# Patient Record
Sex: Female | Born: 1990 | Race: Black or African American | Hispanic: No | Marital: Single | Smoking: Former smoker
Health system: Southern US, Community
[De-identification: ages and names within clinical notes are randomized; demographics above are authoritative.]

## PROBLEM LIST (undated history)

## (undated) ENCOUNTER — Inpatient Hospital Stay (HOSPITAL_COMMUNITY): Payer: Self-pay

## (undated) DIAGNOSIS — D219 Benign neoplasm of connective and other soft tissue, unspecified: Secondary | ICD-10-CM

## (undated) DIAGNOSIS — Z8619 Personal history of other infectious and parasitic diseases: Secondary | ICD-10-CM

## (undated) DIAGNOSIS — B9689 Other specified bacterial agents as the cause of diseases classified elsewhere: Secondary | ICD-10-CM

## (undated) DIAGNOSIS — R51 Headache: Secondary | ICD-10-CM

## (undated) DIAGNOSIS — N760 Acute vaginitis: Secondary | ICD-10-CM

## (undated) HISTORY — DX: Personal history of other infectious and parasitic diseases: Z86.19

## (undated) HISTORY — DX: Headache: R51

## (undated) HISTORY — PX: INDUCED ABORTION: SHX677

---

## 2010-09-01 ENCOUNTER — Emergency Department (HOSPITAL_BASED_OUTPATIENT_CLINIC_OR_DEPARTMENT_OTHER)
Admission: EM | Admit: 2010-09-01 | Discharge: 2010-09-01 | Disposition: A | Discharge status: Home / Self Care | Payer: Self-pay | Attending: Emergency Medicine | Admitting: Emergency Medicine

## 2010-09-01 DIAGNOSIS — R112 Nausea with vomiting, unspecified: Secondary | ICD-10-CM | POA: Insufficient documentation

## 2010-09-01 DIAGNOSIS — E860 Dehydration: Secondary | ICD-10-CM | POA: Insufficient documentation

## 2010-11-02 ENCOUNTER — Emergency Department (HOSPITAL_BASED_OUTPATIENT_CLINIC_OR_DEPARTMENT_OTHER)
Admission: EM | Admit: 2010-11-02 | Discharge: 2010-11-02 | Disposition: A | Discharge status: Home / Self Care | Payer: Self-pay | Attending: Emergency Medicine | Admitting: Emergency Medicine

## 2010-11-02 DIAGNOSIS — A499 Bacterial infection, unspecified: Secondary | ICD-10-CM | POA: Insufficient documentation

## 2010-11-02 DIAGNOSIS — N760 Acute vaginitis: Secondary | ICD-10-CM | POA: Insufficient documentation

## 2010-11-02 DIAGNOSIS — B9689 Other specified bacterial agents as the cause of diseases classified elsewhere: Secondary | ICD-10-CM | POA: Insufficient documentation

## 2010-11-02 LAB — URINALYSIS, ROUTINE W REFLEX MICROSCOPIC
Glucose, UA: NEGATIVE mg/dL
Leukocytes, UA: NEGATIVE
pH: 6.5 (ref 5.0–8.0)

## 2010-11-02 LAB — PREGNANCY, URINE: Preg Test, Ur: NEGATIVE

## 2010-11-02 LAB — WET PREP, GENITAL
Trich, Wet Prep: NONE SEEN
Yeast Wet Prep HPF POC: NONE SEEN

## 2010-11-28 ENCOUNTER — Encounter: Payer: Self-pay | Admitting: *Deleted

## 2010-11-28 ENCOUNTER — Emergency Department (INDEPENDENT_AMBULATORY_CARE_PROVIDER_SITE_OTHER): Payer: Self-pay

## 2010-11-28 ENCOUNTER — Emergency Department (HOSPITAL_BASED_OUTPATIENT_CLINIC_OR_DEPARTMENT_OTHER)
Admission: EM | Admit: 2010-11-28 | Discharge: 2010-11-28 | Disposition: A | Discharge status: Home / Self Care | Payer: Self-pay | Attending: Emergency Medicine | Admitting: Emergency Medicine

## 2010-11-28 DIAGNOSIS — Z3490 Encounter for supervision of normal pregnancy, unspecified, unspecified trimester: Secondary | ICD-10-CM

## 2010-11-28 DIAGNOSIS — R109 Unspecified abdominal pain: Secondary | ICD-10-CM

## 2010-11-28 DIAGNOSIS — D259 Leiomyoma of uterus, unspecified: Secondary | ICD-10-CM

## 2010-11-28 DIAGNOSIS — O3410 Maternal care for benign tumor of corpus uteri, unspecified trimester: Secondary | ICD-10-CM

## 2010-11-28 DIAGNOSIS — O210 Mild hyperemesis gravidarum: Secondary | ICD-10-CM | POA: Insufficient documentation

## 2010-11-28 DIAGNOSIS — R110 Nausea: Secondary | ICD-10-CM

## 2010-11-28 DIAGNOSIS — O2690 Pregnancy related conditions, unspecified, unspecified trimester: Secondary | ICD-10-CM | POA: Insufficient documentation

## 2010-11-28 LAB — URINALYSIS, ROUTINE W REFLEX MICROSCOPIC
Bilirubin Urine: NEGATIVE
Hgb urine dipstick: NEGATIVE
Nitrite: NEGATIVE
Specific Gravity, Urine: 1.029 (ref 1.005–1.030)
pH: 6.5 (ref 5.0–8.0)

## 2010-11-28 MED ORDER — PRENATAL RX 60-1 MG PO TABS: 1.0000 | ORAL_TABLET | Freq: Every day | ORAL | Status: AC

## 2010-11-28 NOTE — ED Provider Notes (Signed)
History     Chief Complaint  Patient presents with  . Nausea   HPI Comments: Pt reports LNMP was may 30th. Believes she could be pregnant. crampy lower abdominal pain  Patient is a 20 y.o. female presenting with GI illlness. The history is provided by the patient.  GI Problem  This is a new problem. The current episode started more than 2 days ago. The problem occurs continuously. The problem has not changed since onset.There has been no fever. Associated symptoms include abdominal pain. Pertinent negatives include no vomiting and no chills. She has tried nothing for the symptoms.    History reviewed. No pertinent past medical history.  History reviewed. No pertinent past surgical history.  History reviewed. No pertinent family history.  History  Substance Use Topics  . Smoking status: Never Smoker   . Smokeless tobacco: Not on file  . Alcohol Use: No    OB History    Grav Para Term Preterm Abortions TAB SAB Ect Mult Living                  Review of Systems  Constitutional: Negative for chills.  Gastrointestinal: Positive for abdominal pain. Negative for vomiting.  Genitourinary: Negative for dysuria, frequency, vaginal bleeding, vaginal discharge and pelvic pain.  Neurological: Negative for light-headedness.  All other systems reviewed and are negative.    Physical Exam  BP 120/65  Pulse 87  Temp 98.6 F (37 C)  Resp 16  Wt 214 lb (97.07 kg)  SpO2 98%  LMP 10/19/2010  Physical Exam  Nursing note and vitals reviewed. Constitutional: She is oriented to person, place, and time. She appears well-developed and well-nourished.  HENT:  Head: Normocephalic and atraumatic.  Eyes: EOM are normal.  Neck: Normal range of motion.  Cardiovascular: Normal rate and regular rhythm.   Pulmonary/Chest: Effort normal and breath sounds normal.  Abdominal: Soft.  Musculoskeletal: Normal range of motion.  Neurological: She is alert and oriented to person, place, and time.    Skin: Skin is warm and dry.  Psychiatric: She has a normal mood and affect.    ED Course  Procedures  MDM Lower abd cramping. Positive pregnancy. Will obtain US to evaluate for intrauterine pregnancy. Vitals normal.    1245: pt well appearing. Single IUP. Home with pregnancy precautions and zofran      Lyanne Co, MD 11/28/10 1247

## 2010-11-28 NOTE — ED Notes (Signed)
Pt c/o nausea x 7 days with lower abd  Cramping  LMP may 30 th

## 2011-05-19 ENCOUNTER — Emergency Department (HOSPITAL_BASED_OUTPATIENT_CLINIC_OR_DEPARTMENT_OTHER)
Admission: EM | Admit: 2011-05-19 | Discharge: 2011-05-19 | Disposition: A | Discharge status: Home / Self Care | Payer: PRIVATE HEALTH INSURANCE | Attending: Emergency Medicine | Admitting: Emergency Medicine

## 2011-05-19 ENCOUNTER — Encounter (HOSPITAL_BASED_OUTPATIENT_CLINIC_OR_DEPARTMENT_OTHER): Payer: Self-pay | Admitting: *Deleted

## 2011-05-19 DIAGNOSIS — K118 Other diseases of salivary glands: Secondary | ICD-10-CM

## 2011-05-19 DIAGNOSIS — J029 Acute pharyngitis, unspecified: Secondary | ICD-10-CM | POA: Insufficient documentation

## 2011-05-19 MED ORDER — FAMOTIDINE 20 MG PO TABS: 20.0000 mg | ORAL_TABLET | Freq: Two times a day (BID) | ORAL | Status: DC

## 2011-05-19 MED ORDER — DEXAMETHASONE SODIUM PHOSPHATE 10 MG/ML IJ SOLN
10.0000 mg | Freq: Once | INTRAMUSCULAR | Status: AC
  Administered 2011-05-19: 10 mg
  Filled 2011-05-19: qty 1

## 2011-05-19 MED ORDER — IBUPROFEN 800 MG PO TABS: 800.0000 mg | ORAL_TABLET | Freq: Three times a day (TID) | ORAL | Status: AC

## 2011-05-19 NOTE — ED Notes (Signed)
Pt states she will call today for apt. To follow up with her dr. Rodena Medin asap to have her thyroid checked, and for recheck of her sore throat.

## 2011-05-19 NOTE — ED Notes (Signed)
Pt amb to room 1 with quick steady gait in nad. Pt reports st x 2 weeks, denies any cough, congestion, fever or other c/o.

## 2011-05-19 NOTE — ED Provider Notes (Signed)
History     CSN: [id number]  Arrival date & time 05/19/11  0910   First MD Initiated Contact with Patient 05/19/11 [phone]      Chief Complaint  Patient presents with  . Sore Throat    (Consider location/radiation/quality/duration/timing/severity/associated sxs/prior treatment) HPI Comments: Intermittent sore throat for the past 2 weeks. Denies any associated infectious symptoms. Has no other complaints. Has no dysphasia or odynophagia.  Pain is worse with eating  Patient is a 20 y.o. female presenting with pharyngitis. The history is provided by the patient. No language interpreter was used.  Sore Throat This is a new problem. The current episode started more than 1 week ago (2 weeks ago). The problem occurs constantly. The problem has not changed since onset.Pertinent negatives include no chest pain, no abdominal pain, no headaches and no shortness of breath. The symptoms are aggravated by swallowing. The symptoms are relieved by nothing. She has tried nothing for the symptoms. The treatment provided no relief.    History reviewed. No pertinent past medical history.  History reviewed. No pertinent past surgical history.  History reviewed. No pertinent family history.  History  Substance Use Topics  . Smoking status: Never Smoker   . Smokeless tobacco: Not on file  . Alcohol Use: No    OB History    Grav Para Term Preterm Abortions TAB SAB Ect Mult Living                  Review of Systems  Constitutional: Negative for fever, activity change, appetite change and fatigue.  HENT: Positive for sore throat. Negative for congestion, rhinorrhea, trouble swallowing, neck pain and neck stiffness.   Respiratory: Negative for cough and shortness of breath.   Cardiovascular: Negative for chest pain and palpitations.  Gastrointestinal: Negative for nausea, vomiting and abdominal pain.  Genitourinary: Negative for dysuria, urgency, frequency and flank pain.  Neurological: Negative  for dizziness, weakness, light-headedness, numbness and headaches.  All other systems reviewed and are negative.    Allergies  Review of patient's allergies indicates no known allergies.  Home Medications   Current Outpatient Rx  Name Route Sig Dispense Refill  . FAMOTIDINE 20 MG PO TABS Oral Take 1 tablet (20 mg total) by mouth 2 (two) times daily. 30 tablet 0  . IBUPROFEN 800 MG PO TABS Oral Take 1 tablet (800 mg total) by mouth 3 (three) times daily. 21 tablet 0  . PRENATAL RX 60-1 MG PO TABS Oral Take 1 tablet by mouth daily. 30 tablet 2    BP 113/72  Pulse 75  Temp(Src) 98.6 F (37 C) (Oral)  Resp 20  Ht 5\' 4"  (1.626 m)  Wt 207 lb (93.895 kg)  BMI 35.53 kg/m2  SpO2 100%  LMP 04/22/2011  Physical Exam  Nursing note and vitals reviewed. Constitutional: She is oriented to person, place, and time. She appears well-developed and well-nourished. No distress.  HENT:  Head: Normocephalic and atraumatic.  Mouth/Throat: Oropharynx is clear and moist. No oropharyngeal exudate.  Eyes: Conjunctivae and EOM are normal. Pupils are equal, round, and reactive to light.  Neck: Normal range of motion. Neck supple.       Tender over the R submandibular salivary gland with mild palpable fullness  Cardiovascular: Normal rate, regular rhythm, normal heart sounds and intact distal pulses.  Exam reveals no gallop and no friction rub.   No murmur heard. Pulmonary/Chest: Effort normal and breath sounds normal. No respiratory distress.  Abdominal: Soft. Bowel sounds are normal. There  is no tenderness.  Musculoskeletal: Normal range of motion. She exhibits no tenderness.  Lymphadenopathy:    She has no cervical adenopathy.  Neurological: She is alert and oriented to person, place, and time. No cranial nerve deficit.  Skin: Skin is warm and dry. No rash noted.    ED Course  Procedures (including critical care time)  Labs Reviewed - No data to display No results found.   1. Pharyngitis     2. Submandibular gland tenderness       MDM  Etiologies considered include small thyroid nodule, submandibular salivary stone, pharyngitis. I also considered cured however she lacks any of these associated symptoms. I will treat her with Pepcid and ibuprofen for discomfort. I encouraged her to use Tylenol this to help expel a possible stone. She has no primary care physician therefore I referred her to the primary care practice here at the [facility]. This time she is safe for discharge to home. She has no respiratory or compromise with swallowing therefore I feel there is no indication for imaging at this time.        Dayton Bailiff, MD 05/19/11 [phone]

## 2011-11-04 ENCOUNTER — Emergency Department (HOSPITAL_BASED_OUTPATIENT_CLINIC_OR_DEPARTMENT_OTHER): Payer: Self-pay

## 2011-11-04 ENCOUNTER — Encounter (HOSPITAL_BASED_OUTPATIENT_CLINIC_OR_DEPARTMENT_OTHER): Payer: Self-pay | Admitting: *Deleted

## 2011-11-04 ENCOUNTER — Emergency Department (HOSPITAL_BASED_OUTPATIENT_CLINIC_OR_DEPARTMENT_OTHER)
Admission: EM | Admit: 2011-11-04 | Discharge: 2011-11-05 | Disposition: A | Discharge status: Home / Self Care | Payer: Self-pay | Attending: Emergency Medicine | Admitting: Emergency Medicine

## 2011-11-04 DIAGNOSIS — N760 Acute vaginitis: Secondary | ICD-10-CM

## 2011-11-04 DIAGNOSIS — R109 Unspecified abdominal pain: Secondary | ICD-10-CM | POA: Insufficient documentation

## 2011-11-04 DIAGNOSIS — B373 Candidiasis of vulva and vagina: Secondary | ICD-10-CM

## 2011-11-04 DIAGNOSIS — R197 Diarrhea, unspecified: Secondary | ICD-10-CM

## 2011-11-04 DIAGNOSIS — R112 Nausea with vomiting, unspecified: Secondary | ICD-10-CM

## 2011-11-04 LAB — DIFFERENTIAL
Basophils Absolute: 0 10*3/uL (ref 0.0–0.1)
Basophils Relative: 0 % (ref 0–1)
Eosinophils Absolute: 0.2 10*3/uL (ref 0.0–0.7)
Eosinophils Relative: 1 % (ref 0–5)
Lymphocytes Relative: 15 % (ref 12–46)
Lymphs Abs: 1.9 10*3/uL (ref 0.7–4.0)
Monocytes Absolute: 0.7 10*3/uL (ref 0.1–1.0)
Monocytes Relative: 6 % (ref 3–12)
Neutro Abs: 9.4 10*3/uL — ABNORMAL HIGH (ref 1.7–7.7)
Neutrophils Relative %: 77 % (ref 43–77)

## 2011-11-04 LAB — COMPREHENSIVE METABOLIC PANEL
ALT: 19 U/L (ref 0–35)
AST: 25 U/L (ref 0–37)
Albumin: 4.4 g/dL (ref 3.5–5.2)
Alkaline Phosphatase: 65 U/L (ref 39–117)
BUN: 10 mg/dL (ref 6–23)
CO2: 27 mEq/L (ref 19–32)
Calcium: 9.9 mg/dL (ref 8.4–10.5)
Chloride: 100 mEq/L (ref 96–112)
Creatinine, Ser: 0.7 mg/dL (ref 0.50–1.10)
GFR calc Af Amer: >90 mL/min (ref >90)
GFR calc non Af Amer: >90 mL/min (ref >90)
Glucose, Bld: 81 mg/dL (ref 70–99)
Potassium: 3.6 mEq/L (ref 3.5–5.1)
Sodium: 138 mEq/L (ref 135–145)
Total Bilirubin: 0.6 mg/dL (ref 0.3–1.2)
Total Protein: 8 g/dL (ref 6.0–8.3)

## 2011-11-04 LAB — CBC
HCT: 46.3 % — ABNORMAL HIGH (ref 36.0–46.0)
MCH: 31 pg (ref 26.0–34.0)
MCHC: 35.9 g/dL (ref 30.0–36.0)
RDW: 13.3 % (ref 11.5–15.5)

## 2011-11-04 LAB — URINALYSIS, ROUTINE W REFLEX MICROSCOPIC
Bilirubin Urine: NEGATIVE
Glucose, UA: NEGATIVE mg/dL
Hgb urine dipstick: NEGATIVE
Ketones, ur: NEGATIVE mg/dL
Leukocytes, UA: NEGATIVE
Nitrite: NEGATIVE
Protein, ur: 30 mg/dL — AB
Specific Gravity, Urine: 1.03 (ref 1.005–1.030)
Urobilinogen, UA: 1 mg/dL (ref 0.0–1.0)
pH: 6.5 (ref 5.0–8.0)

## 2011-11-04 LAB — WET PREP, GENITAL: Trich, Wet Prep: NONE SEEN

## 2011-11-04 LAB — URINE MICROSCOPIC-ADD ON

## 2011-11-04 LAB — PREGNANCY, URINE: Preg Test, Ur: NEGATIVE

## 2011-11-04 MED ORDER — FLUCONAZOLE 200 MG PO TABS: 200.0000 mg | ORAL_TABLET | Freq: Every day | ORAL | Status: AC

## 2011-11-04 MED ORDER — ONDANSETRON HCL 4 MG/2ML IJ SOLN
4.0000 mg | Freq: Once | INTRAMUSCULAR | Status: AC
  Administered 2011-11-04: 4 mg via INTRAVENOUS
  Filled 2011-11-04: qty 2

## 2011-11-04 MED ORDER — ONDANSETRON 8 MG PO TBDP: 8.0000 mg | ORAL_TABLET | Freq: Three times a day (TID) | ORAL | Status: AC | PRN

## 2011-11-04 MED ORDER — METRONIDAZOLE 500 MG PO TABS: 500.0000 mg | ORAL_TABLET | Freq: Two times a day (BID) | ORAL | Status: AC

## 2011-11-04 NOTE — ED Provider Notes (Signed)
History     CSN: [id number]  Arrival date & time 11/04/11  1247   First MD Initiated Contact with Patient 11/04/11 1504      3:10 PM HPI Patient reports multiple symptoms. States she is here for lower abdominal pain, nausea, vomiting, diarrhea, vaginal discharge, typical migraine headache and dizziness. States "I think it is just a cold". Denies fever,  abdominal surgeries, history of STDs, back pain, radiation of pain. Reports recent history of urinary tract infection and bacterial vaginosis. Also reports associated with a mild cough for several days. Patient is a 21 y.o. female presenting with abdominal pain. The history is provided by the patient.  Abdominal Pain The primary symptoms of the illness include abdominal pain, nausea, vomiting, diarrhea and vaginal discharge. The primary symptoms of the illness do not include fever, shortness of breath or dysuria.  The vaginal discharge is not associated with dysuria.  Additional symptoms associated with the illness include chills. Symptoms associated with the illness do not include constipation, urgency, hematuria, frequency or back pain.    History reviewed. No pertinent past medical history.  History reviewed. No pertinent past surgical history.  History reviewed. No pertinent family history.  History  Substance Use Topics  . Smoking status: Never Smoker   . Smokeless tobacco: Not on file  . Alcohol Use: No    OB History    Grav Para Term Preterm Abortions TAB SAB Ect Mult Living                  Review of Systems  Constitutional: Positive for chills. Negative for fever.  Respiratory: Negative for shortness of breath.   Cardiovascular: Negative for chest pain.  Gastrointestinal: Positive for nausea, vomiting, abdominal pain and diarrhea. Negative for constipation.  Genitourinary: Positive for vaginal discharge. Negative for dysuria, urgency, frequency, hematuria, flank pain and vaginal pain.  Musculoskeletal: Negative for  back pain.  Neurological: Positive for dizziness and headaches.  All other systems reviewed and are negative.    Allergies  Review of patient's allergies indicates no known allergies.  Home Medications   Current Outpatient Rx  Name Route Sig Dispense Refill  . FAMOTIDINE 20 MG PO TABS Oral Take 1 tablet (20 mg total) by mouth 2 (two) times daily. 30 tablet 0  . PRENATAL RX 60-1 MG PO TABS Oral Take 1 tablet by mouth daily. 30 tablet 2    BP 127/88  Pulse 90  Temp 98 F (36.7 C) (Oral)  Resp 20  Ht 5\' 4"  (1.626 m)  Wt 209 lb (94.802 kg)  BMI 35.87 kg/m2  SpO2 99%  LMP 10/18/2011  Physical Exam  Vitals reviewed. Constitutional: She is oriented to person, place, and time. Vital signs are normal. She appears well-developed and well-nourished.  HENT:  Head: Normocephalic and atraumatic.  Eyes: Conjunctivae are normal. Pupils are equal, round, and reactive to light.  Neck: Normal range of motion. Neck supple.  Cardiovascular: Normal rate, regular rhythm and normal heart sounds.  Exam reveals no friction rub.   No murmur heard. Pulmonary/Chest: Effort normal and breath sounds normal. She has no wheezes. She has no rhonchi. She has no rales. She exhibits no tenderness.  Abdominal: Soft. Bowel sounds are normal. She exhibits no distension and no mass. There is no tenderness. There is no rebound and no guarding.  Genitourinary: There is no tenderness or lesion on the right labia. There is no tenderness or lesion on the left labia. Uterus is tender. Uterus is not enlarged. Cervix  exhibits no motion tenderness and no discharge. No bleeding around the vagina. Vaginal discharge (white thick discharge) found.  Musculoskeletal: Normal range of motion.  Neurological: She is alert and oriented to person, place, and time. Coordination normal.  Skin: Skin is warm and dry. No rash noted. No erythema. No pallor.    ED Course  Procedures  Results for orders placed during the hospital  encounter of 11/04/11  URINALYSIS, ROUTINE W REFLEX MICROSCOPIC      Component Value Range   Color, Urine AMBER (*) YELLOW   APPearance CLEAR  CLEAR   Specific Gravity, Urine 1.030  1.005 - 1.030   pH 6.5  5.0 - 8.0   Glucose, UA NEGATIVE  NEGATIVE mg/dL   Hgb urine dipstick NEGATIVE  NEGATIVE   Bilirubin Urine NEGATIVE  NEGATIVE   Ketones, ur NEGATIVE  NEGATIVE mg/dL   Protein, ur 30 (*) NEGATIVE mg/dL   Urobilinogen, UA 1.0  0.0 - 1.0 mg/dL   Nitrite NEGATIVE  NEGATIVE   Leukocytes, UA NEGATIVE  NEGATIVE  PREGNANCY, URINE      Component Value Range   Preg Test, Ur NEGATIVE  NEGATIVE  URINE MICROSCOPIC-ADD ON      Component Value Range   Squamous Epithelial / LPF FEW (*) RARE   WBC, UA 0-2  <3 WBC/hpf   Bacteria, UA MANY (*) RARE   Urine-Other MUCOUS PRESENT    CBC      Component Value Range   WBC 12.2 (*) 4.0 - 10.5 K/uL   RBC 5.35 (*) 3.87 - 5.11 MIL/uL   Hemoglobin 16.6 (*) 12.0 - 15.0 g/dL   HCT 45.4 (*) 09.8 - 11.9 %   MCV 86.5  78.0 - 100.0 fL   MCH 31.0  26.0 - 34.0 pg   MCHC 35.9  30.0 - 36.0 g/dL   RDW 14.7  82.9 - 56.2 %   Platelets 231  150 - 400 K/uL  DIFFERENTIAL      Component Value Range   Neutrophils Relative 77  43 - 77 %   Neutro Abs 9.4 (*) 1.7 - 7.7 K/uL   Lymphocytes Relative 15  12 - 46 %   Lymphs Abs 1.9  0.7 - 4.0 K/uL   Monocytes Relative 6  3 - 12 %   Monocytes Absolute 0.7  0.1 - 1.0 K/uL   Eosinophils Relative 1  0 - 5 %   Eosinophils Absolute 0.2  0.0 - 0.7 K/uL   Basophils Relative 0  0 - 1 %   Basophils Absolute 0.0  0.0 - 0.1 K/uL  COMPREHENSIVE METABOLIC PANEL      Component Value Range   Sodium 138  135 - 145 mEq/L   Potassium 3.6  3.5 - 5.1 mEq/L   Chloride 100  96 - 112 mEq/L   CO2 27  19 - 32 mEq/L   Glucose, Bld 81  70 - 99 mg/dL   BUN 10  6 - 23 mg/dL   Creatinine, Ser 1.30  0.50 - 1.10 mg/dL   Calcium 9.9  8.4 - 86.5 mg/dL   Total Protein 8.0  6.0 - 8.3 g/dL   Albumin 4.4  3.5 - 5.2 g/dL   AST 25  0 - 37 U/L   ALT 19   0 - 35 U/L   Alkaline Phosphatase 65  39 - 117 U/L   Total Bilirubin 0.6  0.3 - 1.2 mg/dL   GFR calc non Af Amer >90  >90 mL/min   GFR calc  Af Amer >90  >90 mL/min  LIPASE, BLOOD      Component Value Range   Lipase 19  11 - 59 U/L  WET PREP, GENITAL      Component Value Range   Yeast Wet Prep HPF POC FEW (*) NONE SEEN   Trich, Wet Prep NONE SEEN  NONE SEEN   Clue Cells Wet Prep HPF POC MODERATE (*) NONE SEEN   WBC, Wet Prep HPF POC FEW (*) NONE SEEN   Dg Abd Acute W/chest  11/04/2011  *RADIOLOGY REPORT*  Clinical Data: Cough.  Abdominal pain.  ACUTE ABDOMEN SERIES (ABDOMEN 2 VIEW & CHEST 1 VIEW)  Comparison: None.  Findings:  Cardiopericardial silhouette within normal limits. Mediastinal contours normal. Trachea midline.  No airspace disease or effusion.  No free air underneath the hemidiaphragms.  There is a paucity of rectosigmoid gas which is nonspecific.  No pathologic air fluid levels are identified.  IMPRESSION: No acute abnormality.  Nonobstructive bowel gas pattern.  Original Report Authenticated By: Andreas Newport, M.D.     MDM    Patient reports nausea medicine has helped with nausea. Is able tolerate PO without difficulty. Discussed findings of yeast infection and bacterial vaginosis. Will treat appropriately with antibiotics and antiemetics. Patient voices understanding and is ready for discharge      Thomasene Lot, Cordelia Poche 11/04/11 1812

## 2011-11-04 NOTE — ED Notes (Signed)
Pt describes abd pain, N/V x 2 days. Bloody diarrhea. Unable to keep PO's down. Dizzy and states she is also getting a migraine. PERL.

## 2011-11-04 NOTE — Discharge Instructions (Signed)
Bacterial Vaginosis Bacterial vaginosis (BV) is a vaginal infection where the normal balance of bacteria in the vagina is disrupted. The normal balance is then replaced by an overgrowth of certain bacteria. There are several different kinds of bacteria that can cause BV. BV is the most common vaginal infection in women of childbearing age. CAUSES   The cause of BV is not fully understood. BV develops when there is an increase or imbalance of harmful bacteria.   Some activities or behaviors can upset the normal balance of bacteria in the vagina and put women at increased risk including:   Having a new sex partner or multiple sex partners.   Douching.   Using an intrauterine device (IUD) for contraception.   It is not clear what role sexual activity plays in the development of BV. However, women that have never had sexual intercourse are rarely infected with BV.  Women do not get BV from toilet seats, bedding, swimming pools or from touching objects around them.  SYMPTOMS   Grey vaginal discharge.   A fish-like odor with discharge, especially after sexual intercourse.   Itching or burning of the vagina and vulva.   Burning or pain with urination.   Some women have no signs or symptoms at all.  DIAGNOSIS  Your caregiver must examine the vagina for signs of BV. Your caregiver will perform lab tests and look at the sample of vaginal fluid through a microscope. They will look for bacteria and abnormal cells (clue cells), a pH test higher than 4.5, and a positive amine test all associated with BV.  RISKS AND COMPLICATIONS   Pelvic inflammatory disease (PID).   Infections following gynecology surgery.   Developing HIV.   Developing herpes virus.  TREATMENT  Sometimes BV will clear up without treatment. However, all women with symptoms of BV should be treated to avoid complications, especially if gynecology surgery is planned. Female partners generally do not need to be treated. However,  BV may spread between female sex partners so treatment is helpful in preventing a recurrence of BV.   BV may be treated with antibiotics. The antibiotics come in either pill or vaginal cream forms. Either can be used with nonpregnant or pregnant women, but the recommended dosages differ. These antibiotics are not harmful to the baby.   BV can recur after treatment. If this happens, a second round of antibiotics will often be prescribed.   Treatment is important for pregnant women. If not treated, BV can cause a premature delivery, especially for a pregnant woman who had a premature birth in the past. All pregnant women who have symptoms of BV should be checked and treated.   For chronic reoccurrence of BV, treatment with a type of prescribed gel vaginally twice a week is helpful.  HOME CARE INSTRUCTIONS   Finish all medication as directed by your caregiver.   Do not have sex until treatment is completed.   Tell your sexual partner that you have a vaginal infection. They should see their caregiver and be treated if they have problems, such as a mild rash or itching.   Practice safe sex. Use condoms. Only have 1 sex partner.  PREVENTION  Basic prevention steps can help reduce the risk of upsetting the natural balance of bacteria in the vagina and developing BV:  Do not have sexual intercourse (be abstinent).   Do not douche.   Use all of the medicine prescribed for treatment of BV, even if the signs and symptoms go away.     Tell your sex partner if you have BV. That way, they can be treated, if needed, to prevent reoccurrence.  SEEK MEDICAL CARE IF:   Your symptoms are not improving after 3 days of treatment.   You have increased discharge, pain, or fever.  MAKE SURE YOU:   Understand these instructions.   Will watch your condition.   Will get help right away if you are not doing well or get worse.  FOR MORE INFORMATION  Division of STD Prevention (DSTDP), Centers for Disease  Control and Prevention: www.cdc.gov/std American [employer] Health Association (ASHA): www.ashastd.org  Document Released: 05/08/2005 Document Revised: 04/27/2011 Document Reviewed: 10/29/2008 ExitCare Patient Information 2012 [employer].Candida Infection, Adult A candida infection (also called yeast, fungus and Monilia infection) is an overgrowth of yeast that can occur anywhere on the body. A yeast infection commonly occurs in warm, moist body areas. Usually, the infection remains localized but can spread to become a systemic infection. A yeast infection may be a sign of a more severe disease such as diabetes, leukemia, or AIDS. A yeast infection can occur in both men and women. In women, Candida vaginitis is a vaginal infection. It is one of the most common causes of vaginitis. Men usually do not have symptoms or know they have an infection until other problems develop. Men may find out they have a yeast infection because their sex partner has a yeast infection. Uncircumcised men are more likely to get a yeast infection than circumcised men. This is because the uncircumcised glans is not exposed to air and does not remain as dry as that of a circumcised glans. Older adults may develop yeast infections around dentures. CAUSES  Women  Antibiotics.   Steroid medication taken for a long time.   Being overweight (obese).   Diabetes.   Poor immune condition.   Certain serious medical conditions.   Immune suppressive medications for organ transplant patients.   Chemotherapy.   Pregnancy.   Menstration.   Stress and fatigue.   Intravenous drug use.   Oral contraceptives.   Wearing tight-fitting clothes in the crotch area.   Catching it from a sex partner who has a yeast infection.   Spermicide.   Intravenous, urinary, or other catheters.  Men  Catching it from a sex partner who has a yeast infection.   Having oral or anal sex with a person who has the infection.    Spermicide.   Diabetes.   Antibiotics.   Poor immune system.   Medications that suppress the immune system.   Intravenous drug use.   Intravenous, urinary, or other catheters.  SYMPTOMS  Women  Thick, white vaginal discharge.   Vaginal itching.   Redness and swelling in and around the vagina.   Irritation of the lips of the vagina and perineum.   Blisters on the vaginal lips and perineum.   Painful sexual intercourse.   Low blood sugar (hypoglycemia).   Painful urination.   Bladder infections.   Intestinal problems such as constipation, indigestion, bad breath, bloating, increase in gas, diarrhea, or loose stools.  Men  Men may develop intestinal problems such as constipation, indigestion, bad breath, bloating, increase in gas, diarrhea, or loose stools.   Dry, cracked skin on the penis with itching or discomfort.   Jock itch.   Dry, flaky skin.   Athlete's foot.   Hypoglycemia.  DIAGNOSIS  Women  A history and an exam are performed.   The discharge may be examined under a microscope.     A culture may be taken of the discharge.  Men  A history and an exam are performed.   Any discharge from the penis or areas of cracked skin will be looked at under the microscope and cultured.   Stool samples may be cultured.  TREATMENT  Women  Vaginal antifungal suppositories and creams.   Medicated creams to decrease irritation and itching on the outside of the vagina.   Warm compresses to the perineal area to decrease swelling and discomfort.   Oral antifungal medications.   Medicated vaginal suppositories or cream for repeated or recurrent infections.   Wash and dry the irritation areas before applying the cream.   Eating yogurt with lactobacillus may help with prevention and treatment.   Sometimes painting the vagina with gentian violet solution may help if creams and suppositories do not work.  Men  Antifungal creams and oral antifungal  medications.   Sometimes treatment must continue for 30 days after the symptoms go away to prevent recurrence.  HOME CARE INSTRUCTIONS  Women  Use cotton underwear and avoid tight-fitting clothing.   Avoid colored, scented toilet paper and deodorant tampons or pads.   Do not douche.   Keep your diabetes under control.   Finish all the prescribed medications.   Keep your skin clean and dry.   Consume milk or yogurt with lactobacillus active culture regularly. If you get frequent yeast infections and think that is what the infection is, there are over-the-counter medications that you can get. If the infection does not show healing in 3 days, talk to your caregiver.   Tell your sex partner you have a yeast infection. Your partner may need treatment also, especially if your infection does not clear up or recurs.  Men  Keep your skin clean and dry.   Keep your diabetes under control.   Finish all prescribed medications.   Tell your sex partner that you have a yeast infection so they can be treated if necessary.  SEEK MEDICAL CARE IF:   Your symptoms do not clear up or worsen in one week after treatment.   You have an oral temperature above 102 F (38.9 C).   You have trouble swallowing or eating for a prolonged time.   You develop blisters on and around your vagina.   You develop vaginal bleeding and it is not your menstrual period.   You develop abdominal pain.   You develop intestinal problems as mentioned above.   You get weak or lightheaded.   You have painful or increased urination.   You have pain during sexual intercourse.  MAKE SURE YOU:   Understand these instructions.   Will watch your condition.   Will get help right away if you are not doing well or get worse.  Document Released: 06/15/2004 Document Revised: 04/27/2011 Document Reviewed: 09/27/2009 ExitCare Patient Information 2012 [employer]. 

## 2011-11-05 NOTE — ED Provider Notes (Signed)
Medical screening examination/treatment/procedure(s) were performed by non-physician practitioner and as supervising physician I was immediately available for consultation/collaboration.   Forbes Cellar, MD 11/05/11 [phone]

## 2011-11-06 LAB — GC/CHLAMYDIA PROBE AMP, GENITAL: GC Probe Amp, Genital: NEGATIVE

## 2012-01-17 ENCOUNTER — Emergency Department (HOSPITAL_BASED_OUTPATIENT_CLINIC_OR_DEPARTMENT_OTHER): Payer: Self-pay

## 2012-01-17 ENCOUNTER — Emergency Department (HOSPITAL_BASED_OUTPATIENT_CLINIC_OR_DEPARTMENT_OTHER)
Admission: EM | Admit: 2012-01-17 | Discharge: 2012-01-17 | Disposition: A | Discharge status: Home / Self Care | Payer: Self-pay | Attending: Emergency Medicine | Admitting: Emergency Medicine

## 2012-01-17 ENCOUNTER — Encounter (HOSPITAL_BASED_OUTPATIENT_CLINIC_OR_DEPARTMENT_OTHER): Payer: Self-pay | Admitting: Family Medicine

## 2012-01-17 DIAGNOSIS — N949 Unspecified condition associated with female genital organs and menstrual cycle: Secondary | ICD-10-CM | POA: Insufficient documentation

## 2012-01-17 DIAGNOSIS — D259 Leiomyoma of uterus, unspecified: Secondary | ICD-10-CM | POA: Insufficient documentation

## 2012-01-17 DIAGNOSIS — A499 Bacterial infection, unspecified: Secondary | ICD-10-CM | POA: Insufficient documentation

## 2012-01-17 DIAGNOSIS — B9689 Other specified bacterial agents as the cause of diseases classified elsewhere: Secondary | ICD-10-CM | POA: Insufficient documentation

## 2012-01-17 DIAGNOSIS — N760 Acute vaginitis: Secondary | ICD-10-CM | POA: Insufficient documentation

## 2012-01-17 DIAGNOSIS — D219 Benign neoplasm of connective and other soft tissue, unspecified: Secondary | ICD-10-CM

## 2012-01-17 HISTORY — DX: Benign neoplasm of connective and other soft tissue, unspecified: D21.9

## 2012-01-17 LAB — URINALYSIS, ROUTINE W REFLEX MICROSCOPIC
Bilirubin Urine: NEGATIVE
Glucose, UA: NEGATIVE mg/dL
Hgb urine dipstick: NEGATIVE
Protein, ur: NEGATIVE mg/dL
Urobilinogen, UA: 2 mg/dL — ABNORMAL HIGH (ref 0.0–1.0)

## 2012-01-17 LAB — WET PREP, GENITAL: Trich, Wet Prep: NONE SEEN

## 2012-01-17 MED ORDER — METRONIDAZOLE 500 MG PO TABS: 500.0000 mg | ORAL_TABLET | Freq: Two times a day (BID) | ORAL | Status: AC

## 2012-01-17 NOTE — ED Notes (Signed)
Pt c/o lower abdomen pain "pressure" and sharp x 10days. Pt denies n/v/d. Pt denies dysuria.

## 2012-01-17 NOTE — ED Provider Notes (Signed)
History     CSN: [id number]  Arrival date & time 01/17/12  1105   First MD Initiated Contact with Patient 01/17/12 1221      Chief Complaint  Patient presents with  . Abdominal Pain    (Consider location/radiation/quality/duration/timing/severity/associated sxs/prior treatment) Patient is a 21 y.o. female presenting with abdominal pain. The history is provided by the patient. No language interpreter was used.  Abdominal Pain The primary symptoms of the illness include abdominal pain. The primary symptoms of the illness do not include nausea or vomiting. Episode onset: 2 weeks. The onset of the illness was gradual. The problem has not changed since onset. The illness is associated with recent sexual activity. The patient states that she believes she is currently not pregnant. Additional symptoms associated with the illness include urgency. Symptoms associated with the illness do not include chills, constipation or back pain. Significant associated medical issues do not include PUD, GERD, diabetes or gallstones.   Pt complains of pain in her lower abdomen.   Pt reports she has pain with intercourse.  Pt denies nausea or vomitting.  No gyn problems. Past Medical History  Diagnosis Date  . Fibroids     History reviewed. No pertinent past surgical history.  No family history on file.  History  Substance Use Topics  . Smoking status: Current Everyday Smoker  . Smokeless tobacco: Not on file  . Alcohol Use: Yes    OB History    Grav Para Term Preterm Abortions TAB SAB Ect Mult Living                  Review of Systems  Constitutional: Negative for chills.  Gastrointestinal: Positive for abdominal pain. Negative for nausea, vomiting and constipation.  Genitourinary: Positive for urgency.  Musculoskeletal: Negative for back pain.  All other systems reviewed and are negative.    Allergies  Review of patient's allergies indicates no known allergies.  Home Medications    Current Outpatient Rx  Name Route Sig Dispense Refill  . FAMOTIDINE 20 MG PO TABS Oral Take 1 tablet (20 mg total) by mouth 2 (two) times daily. 30 tablet 0    BP 129/74  Pulse 79  Temp 98.6 F (37 C) (Oral)  Resp 16  SpO2 100%  LMP 01/01/2012  Physical Exam  Nursing note and vitals reviewed. Constitutional: She is oriented to person, place, and time. She appears well-developed and well-nourished.  HENT:  Head: Normocephalic and atraumatic.  Eyes: Pupils are equal, round, and reactive to light.  Neck: Normal range of motion.  Cardiovascular: Normal rate.   Pulmonary/Chest: Effort normal.  Abdominal: Soft. There is tenderness.  Genitourinary:       Scant white discharge,  Adnexa no tender,  Cervix nontender,  Pain with palpation of uterus  Neurological: She is alert and oriented to person, place, and time. She has normal reflexes.  Skin: Skin is warm.  Psychiatric: She has a normal mood and affect.    ED Course  Procedures (including critical care time)  Labs Reviewed  URINALYSIS, ROUTINE W REFLEX MICROSCOPIC - Abnormal; Notable for the following:    Color, Urine AMBER (*)  BIOCHEMICALS MAY BE AFFECTED BY COLOR   Urobilinogen, UA 2.0 (*)     All other components within normal limits  PREGNANCY, URINE  WET PREP, GENITAL  GC/CHLAMYDIA PROBE AMP, GENITAL   US Transvaginal Non-ob  01/17/2012  *RADIOLOGY REPORT*  Clinical Data: Pelvic pain for 10 days.  Fibroids  TRANSABDOMINAL AND TRANSVAGINAL ULTRASOUND  OF PELVIS Technique:  Both transabdominal and transvaginal ultrasound examinations of the pelvis were performed. Transabdominal technique was performed for global imaging of the pelvis including uterus, ovaries, adnexal regions, and pelvic cul-de-sac.  It was necessary to proceed with endovaginal exam following the transabdominal exam to visualize the endometrium.  Comparison:  11/28/2010  Findings:  Uterus: Anteverted, anteflexed.  8.0 x 4.5 x 4.5 cm.  Anterior fundal  uterine body intramural fibroid measures 1.3 by 1.2 x 0.6 cm.  Posterior right lateral intramural fibroid measures 2.3 x 1.9 x 1.5 cm.  No subserosal component or mass effect upon the endometrium.  Endometrium: 9 mm. No focal abnormality.  Borderline trilaminar in appearance.  Right ovary:  2.8 x 2.5 x 2.2 cm.  Normal.  Left ovary: 2.6 x 2.4 x 2.1 cm.  Normal.  Other findings: Small free fluid noted.  IMPRESSION: Small intramural fibroids as above.  Otherwise normal exam.   Original Report Authenticated By: Harrel Lemon, M.D.    US Pelvis Complete  01/17/2012  *RADIOLOGY REPORT*  Clinical Data: Pelvic pain for 10 days.  Fibroids  TRANSABDOMINAL AND TRANSVAGINAL ULTRASOUND OF PELVIS Technique:  Both transabdominal and transvaginal ultrasound examinations of the pelvis were performed. Transabdominal technique was performed for global imaging of the pelvis including uterus, ovaries, adnexal regions, and pelvic cul-de-sac.  It was necessary to proceed with endovaginal exam following the transabdominal exam to visualize the endometrium.  Comparison:  11/28/2010  Findings:  Uterus: Anteverted, anteflexed.  8.0 x 4.5 x 4.5 cm.  Anterior fundal uterine body intramural fibroid measures 1.3 by 1.2 x 0.6 cm.  Posterior right lateral intramural fibroid measures 2.3 x 1.9 x 1.5 cm.  No subserosal component or mass effect upon the endometrium.  Endometrium: 9 mm. No focal abnormality.  Borderline trilaminar in appearance.  Right ovary:  2.8 x 2.5 x 2.2 cm.  Normal.  Left ovary: 2.6 x 2.4 x 2.1 cm.  Normal.  Other findings: Small free fluid noted.  IMPRESSION: Small intramural fibroids as above.  Otherwise normal exam.   Original Report Authenticated By: Harrel Lemon, M.D.      1. Fibroid   2. Bacterial vaginitis       MDM  I counseled pt on fibroids, pt referred to gyn for evaluation        Elson Areas, PA 01/17/12 [address] [place], [place] 01/17/12 1451

## 2012-01-17 NOTE — ED Notes (Signed)
Pt reports prior HX of uterine fibroids and reports increased pain in pelvic region s/p coitus.

## 2012-01-17 NOTE — ED Provider Notes (Signed)
Medical screening examination/treatment/procedure(s) were performed by non-physician practitioner and as supervising physician I was immediately available for consultation/collaboration.   Perez Dirico, MD 01/17/12 1532 

## 2012-01-18 LAB — GC/CHLAMYDIA PROBE AMP, GENITAL
Chlamydia, DNA Probe: NEGATIVE
GC Probe Amp, Genital: NEGATIVE

## 2012-02-04 ENCOUNTER — Emergency Department (HOSPITAL_BASED_OUTPATIENT_CLINIC_OR_DEPARTMENT_OTHER)
Admission: EM | Admit: 2012-02-04 | Discharge: 2012-02-04 | Disposition: A | Discharge status: Home / Self Care | Payer: Self-pay | Attending: Emergency Medicine | Admitting: Emergency Medicine

## 2012-02-04 ENCOUNTER — Encounter (HOSPITAL_BASED_OUTPATIENT_CLINIC_OR_DEPARTMENT_OTHER): Payer: Self-pay | Admitting: *Deleted

## 2012-02-04 DIAGNOSIS — A499 Bacterial infection, unspecified: Secondary | ICD-10-CM | POA: Insufficient documentation

## 2012-02-04 DIAGNOSIS — B379 Candidiasis, unspecified: Secondary | ICD-10-CM

## 2012-02-04 DIAGNOSIS — B9689 Other specified bacterial agents as the cause of diseases classified elsewhere: Secondary | ICD-10-CM | POA: Insufficient documentation

## 2012-02-04 DIAGNOSIS — N760 Acute vaginitis: Secondary | ICD-10-CM | POA: Insufficient documentation

## 2012-02-04 DIAGNOSIS — B49 Unspecified mycosis: Secondary | ICD-10-CM | POA: Insufficient documentation

## 2012-02-04 DIAGNOSIS — F17200 Nicotine dependence, unspecified, uncomplicated: Secondary | ICD-10-CM | POA: Insufficient documentation

## 2012-02-04 LAB — WET PREP, GENITAL

## 2012-02-04 MED ORDER — METRONIDAZOLE 500 MG PO TABS
ORAL_TABLET | ORAL | Status: AC
  Filled 2012-02-04: qty 1

## 2012-02-04 MED ORDER — METRONIDAZOLE 500 MG PO TABS: 500.0000 mg | ORAL_TABLET | Freq: Two times a day (BID) | ORAL | Status: AC

## 2012-02-04 MED ORDER — FLUCONAZOLE 50 MG PO TABS
ORAL_TABLET | ORAL | Status: AC
  Filled 2012-02-04: qty 1

## 2012-02-04 MED ORDER — FLUCONAZOLE 50 MG PO TABS
150.0000 mg | ORAL_TABLET | Freq: Once | ORAL | Status: AC
  Administered 2012-02-04: 150 mg via ORAL

## 2012-02-04 MED ORDER — FLUCONAZOLE 150 MG PO TABS: 150.0000 mg | ORAL_TABLET | Freq: Once | ORAL | Status: AC

## 2012-02-04 MED ORDER — FLUCONAZOLE 100 MG PO TABS
ORAL_TABLET | ORAL | Status: AC
  Filled 2012-02-04: qty 1

## 2012-02-04 MED ORDER — METRONIDAZOLE 500 MG PO TABS
500.0000 mg | ORAL_TABLET | Freq: Once | ORAL | Status: AC
  Administered 2012-02-04: 500 mg via ORAL

## 2012-02-04 NOTE — ED Provider Notes (Signed)
History     CSN: [id number]  Arrival date & time 02/04/12  1309   First MD Initiated Contact with Patient 02/04/12 1332      Chief Complaint  Patient presents with  . Vaginal Discharge    (Consider location/radiation/quality/duration/timing/severity/associated sxs/prior treatment) Patient is a 21 y.o. female presenting with vaginal discharge. The history is provided by the patient. No language interpreter was used.  Vaginal Discharge This is a new problem. The current episode started today. The problem occurs constantly. The problem has been gradually worsening. Associated symptoms comments: Vaginal irritation. Nothing aggravates the symptoms. She has tried nothing for the symptoms. The treatment provided moderate relief.  Pt complains of irritation.  Pt feels like she has a yeast infection.  Past Medical History  Diagnosis Date  . Fibroids     History reviewed. No pertinent past surgical history.  History reviewed. No pertinent family history.  History  Substance Use Topics  . Smoking status: Current Every Day Smoker  . Smokeless tobacco: Not on file  . Alcohol Use: Yes    OB History    Grav Para Term Preterm Abortions TAB SAB Ect Mult Living                  Review of Systems  Genitourinary: Positive for vaginal discharge.  All other systems reviewed and are negative.    Allergies  Review of patient's allergies indicates no known allergies.  Home Medications   Current Outpatient Rx  Name Route Sig Dispense Refill  . FAMOTIDINE 20 MG PO TABS Oral Take 1 tablet (20 mg total) by mouth 2 (two) times daily. 30 tablet 0    BP 128/77  Pulse 98  Temp 98.4 F (36.9 C) (Oral)  Resp 20  Ht 5\' 4"  (1.626 m)  Wt 206 lb (93.441 kg)  BMI 35.36 kg/m2  SpO2 100%  LMP 02/04/2012  Physical Exam  Nursing note and vitals reviewed. Constitutional: She is oriented to person, place, and time. She appears well-developed and well-nourished.  HENT:  Head: Normocephalic.   Eyes: Conjunctivae normal and EOM are normal. Pupils are equal, round, and reactive to light.  Neck: Normal range of motion. Neck supple.  Cardiovascular: Normal rate and regular rhythm.   Pulmonary/Chest: Effort normal.  Abdominal: Soft. Bowel sounds are normal.  Genitourinary: Vaginal discharge found.  Musculoskeletal: Normal range of motion.  Neurological: She is alert and oriented to person, place, and time. She has normal reflexes.  Skin: Skin is warm.    ED Course  Procedures (including critical care time)  Labs Reviewed  WET PREP, GENITAL - Abnormal; Notable for the following:    Yeast Wet Prep HPF POC TOO NUMEROUS TO COUNT (*)     Clue Cells Wet Prep HPF POC MANY (*)     WBC, Wet Prep HPF POC FEW (*)     All other components within normal limits  GC/CHLAMYDIA PROBE AMP, GENITAL   No results found.   No diagnosis found.    MDM  Pt has many clue cells and yeast.  Pt given rx for flagyl and diflucan        Elson Areas, [place] 02/04/12 1519

## 2012-02-04 NOTE — ED Notes (Signed)
Pelvic cart at the bedside set up and ready for the doctor to use.

## 2012-02-04 NOTE — ED Notes (Addendum)
Pt states she was seen here last week and dx'd with bacterial vaginosis. Placed on meds. Now has thick white discharge with itching. No odor. Irritated. Also having sinus issues and wants a red spot on her right eye checked that has been there since Thursday. On period now.

## 2012-02-08 NOTE — ED Provider Notes (Signed)
History/physical exam/procedure(s) were performed by non-physician practitioner and as supervising physician I was immediately available for consultation/collaboration. I have reviewed all notes and am in agreement with care and plan.   Hilario Quarry, MD 02/08/12 [phone]

## 2012-03-28 ENCOUNTER — Encounter (HOSPITAL_BASED_OUTPATIENT_CLINIC_OR_DEPARTMENT_OTHER): Payer: Self-pay | Admitting: *Deleted

## 2012-03-28 ENCOUNTER — Emergency Department (HOSPITAL_BASED_OUTPATIENT_CLINIC_OR_DEPARTMENT_OTHER)
Admission: EM | Admit: 2012-03-28 | Discharge: 2012-03-28 | Disposition: A | Discharge status: Home / Self Care | Payer: Self-pay | Attending: Emergency Medicine | Admitting: Emergency Medicine

## 2012-03-28 DIAGNOSIS — B379 Candidiasis, unspecified: Secondary | ICD-10-CM

## 2012-03-28 DIAGNOSIS — N760 Acute vaginitis: Secondary | ICD-10-CM

## 2012-03-28 DIAGNOSIS — F17200 Nicotine dependence, unspecified, uncomplicated: Secondary | ICD-10-CM | POA: Insufficient documentation

## 2012-03-28 LAB — URINALYSIS, ROUTINE W REFLEX MICROSCOPIC
Glucose, UA: NEGATIVE mg/dL
Hgb urine dipstick: NEGATIVE
Protein, ur: NEGATIVE mg/dL
Specific Gravity, Urine: 1.029 (ref 1.005–1.030)

## 2012-03-28 LAB — WET PREP, GENITAL
Clue Cells Wet Prep HPF POC: NONE SEEN
Trich, Wet Prep: NONE SEEN

## 2012-03-28 LAB — URINE MICROSCOPIC-ADD ON

## 2012-03-28 LAB — PREGNANCY, URINE: Preg Test, Ur: NEGATIVE

## 2012-03-28 MED ORDER — FLUCONAZOLE 150 MG PO TABS: 150.0000 mg | ORAL_TABLET | Freq: Once | ORAL | Status: DC

## 2012-03-28 MED ORDER — METRONIDAZOLE 500 MG PO TABS: 500.0000 mg | ORAL_TABLET | Freq: Two times a day (BID) | ORAL | Status: DC

## 2012-03-28 NOTE — ED Provider Notes (Signed)
History     CSN: [id number]  Arrival date & time 03/28/12  1621   First MD Initiated Contact with Patient 03/28/12 1726      Chief Complaint  Patient presents with  . Vaginitis    (Consider location/radiation/quality/duration/timing/severity/associated sxs/prior treatment) Patient is a 21 y.o. female presenting with vaginal discharge. The history is provided by the patient. No language interpreter was used.  Vaginal Discharge This is a new problem. The current episode started today. The problem occurs constantly. The problem has been gradually worsening. Nothing aggravates the symptoms. She has tried nothing for the symptoms. The treatment provided moderate relief.   Pt complains of a vaginal discharge Pt has a history of frequent Bv and yeast Past Medical History  Diagnosis Date  . Fibroids     History reviewed. No pertinent past surgical history.  No family history on file.  History  Substance Use Topics  . Smoking status: Current Every Day Smoker -- 0.5 packs/day    Types: Cigarettes  . Smokeless tobacco: Not on file  . Alcohol Use: Yes    OB History    Grav Para Term Preterm Abortions TAB SAB Ect Mult Living                  Review of Systems  Genitourinary: Positive for vaginal discharge.  All other systems reviewed and are negative.    Allergies  Review of patient's allergies indicates no known allergies.  Home Medications   Current Outpatient Rx  Name  Route  Sig  Dispense  Refill  . METRONIDAZOLE 500 MG PO TABS   Oral   Take 500 mg by mouth 3 (three) times daily.           BP 116/75  Pulse 94  Temp 98.7 F (37.1 C) (Oral)  Resp 20  SpO2 100%  LMP 03/07/2012  Physical Exam  Nursing note and vitals reviewed. Constitutional: She is oriented to person, place, and time. She appears well-developed and well-nourished.  HENT:  Head: Normocephalic and atraumatic.  Eyes: Pupils are equal, round, and reactive to light.  Cardiovascular: Normal  rate and normal heart sounds.   Pulmonary/Chest: Effort normal.  Abdominal: Soft. Bowel sounds are normal.  Genitourinary: Vaginal discharge found.  Musculoskeletal: Normal range of motion.  Neurological: She is alert and oriented to person, place, and time.  Skin: Skin is warm.    ED Course  Procedures (including critical care time)  Labs Reviewed  URINALYSIS, ROUTINE W REFLEX MICROSCOPIC - Abnormal; Notable for the following:    APPearance CLOUDY (*)     Urobilinogen, UA 2.0 (*)     Nitrite POSITIVE (*)     Leukocytes, UA SMALL (*)     All other components within normal limits  URINE MICROSCOPIC-ADD ON - Abnormal; Notable for the following:    Squamous Epithelial / LPF MANY (*)     Bacteria, UA MANY (*)     All other components within normal limits  PREGNANCY, URINE  URINE CULTURE  GC/CHLAMYDIA PROBE AMP  WET PREP, GENITAL   No results found.   1. Vaginitis   2. Yeast infection       MDM  rx for diflucan and flagyl        Elson Areas, PA 03/28/12 1821  Lonia Skinner [place], [place] 03/28/12 Rickey Primus

## 2012-03-28 NOTE — ED Notes (Signed)
Vaginal discharge. Thinks she has a yeast infection. She was just treated for BV and yeast less than a month ago her pt.

## 2012-03-28 NOTE — ED Notes (Signed)
Chart reviewed.

## 2012-03-29 NOTE — ED Provider Notes (Signed)
Medical screening examination/treatment/procedure(s) were conducted as a shared visit with non-physician practitioner(s) and myself.  I personally evaluated the patient during the encounter   Carleene Cooper III, MD 03/29/12 1432

## 2012-03-30 LAB — URINE CULTURE

## 2012-03-31 NOTE — ED Notes (Signed)
+   Urine Chart sent to EDP office for review. 

## 2012-04-02 ENCOUNTER — Telehealth (HOSPITAL_COMMUNITY): Payer: Self-pay | Admitting: *Deleted

## 2012-04-02 NOTE — ED Notes (Signed)
Chart returned from EDP office  With order written by Trixie Dredge for  Bactrim DS 1 tab po BID x 3 days that needs to be called to pharmacy.

## 2012-04-02 NOTE — ED Notes (Signed)
  Message left with grandmother to have patient return call.

## 2012-04-06 ENCOUNTER — Telehealth (HOSPITAL_COMMUNITY): Payer: Self-pay | Admitting: Emergency Medicine

## 2012-05-12 NOTE — ED Notes (Signed)
Unable to contact patient via phone. Sent letter. °

## 2012-05-12 NOTE — ED Notes (Signed)
No response to letter sent after 30 days. Chart sent to Medical Records. °

## 2012-05-22 NOTE — L&D Delivery Note (Signed)
Delivery Note At 4:05 AM a viable female,. "Gina Burns", was delivered via Vaginal, Spontaneous Delivery (Presentation: Left Occiput Anterior).  APGAR: 8, 9; weight .   Placenta status: Intact, Spontaneous.  Cord: 3 vessels with the following complications: None.  Moderately tight CAN x 1--reduced over shoulders at delivery.  Cord pH: NA  Anesthesia: Epidural  Episiotomy: None Lacerations: qst degree right labia minora; 1st degree periurethral Suture Repair: 3.0 vicryl Est. Blood Loss (mL): 200 cc  Mom to postpartum.  Baby to Couplet care / Skin to Skin. MRSA screen negative--patient removed from isolation. Staph aureus screen positive.  Nigel Bridgeman 05/03/2013, 4:42 AM

## 2012-07-16 ENCOUNTER — Encounter (HOSPITAL_BASED_OUTPATIENT_CLINIC_OR_DEPARTMENT_OTHER): Payer: Self-pay | Admitting: *Deleted

## 2012-07-16 ENCOUNTER — Emergency Department (HOSPITAL_BASED_OUTPATIENT_CLINIC_OR_DEPARTMENT_OTHER)
Admission: EM | Admit: 2012-07-16 | Discharge: 2012-07-16 | Disposition: A | Discharge status: Home / Self Care | Payer: Self-pay | Attending: Emergency Medicine | Admitting: Emergency Medicine

## 2012-07-16 DIAGNOSIS — N898 Other specified noninflammatory disorders of vagina: Secondary | ICD-10-CM | POA: Insufficient documentation

## 2012-07-16 DIAGNOSIS — F17200 Nicotine dependence, unspecified, uncomplicated: Secondary | ICD-10-CM | POA: Insufficient documentation

## 2012-07-16 DIAGNOSIS — N760 Acute vaginitis: Secondary | ICD-10-CM | POA: Insufficient documentation

## 2012-07-16 DIAGNOSIS — Z8742 Personal history of other diseases of the female genital tract: Secondary | ICD-10-CM | POA: Insufficient documentation

## 2012-07-16 LAB — PREGNANCY, URINE: Preg Test, Ur: NEGATIVE

## 2012-07-16 LAB — URINALYSIS, ROUTINE W REFLEX MICROSCOPIC
Bilirubin Urine: NEGATIVE
Glucose, UA: NEGATIVE mg/dL
Hgb urine dipstick: NEGATIVE
Specific Gravity, Urine: 1.025 (ref 1.005–1.030)
Urobilinogen, UA: 1 mg/dL (ref 0.0–1.0)

## 2012-07-16 LAB — URINE MICROSCOPIC-ADD ON

## 2012-07-16 LAB — WET PREP, GENITAL: Yeast Wet Prep HPF POC: NONE SEEN

## 2012-07-16 MED ORDER — CLINDAMYCIN PHOSPHATE 2 % VA CREA: 1.0000 | TOPICAL_CREAM | Freq: Every day | VAGINAL | Status: DC

## 2012-07-16 NOTE — ED Provider Notes (Signed)
History     CSN: [id number]  Arrival date & time 07/16/12  1002   First MD Initiated Contact with Patient 07/16/12 1209      Chief Complaint  Patient presents with  . Pelvic Pain    (Consider location/radiation/quality/duration/timing/severity/associated sxs/prior treatment) Patient is a 22 y.o. female presenting with vaginal discharge. The history is provided by the patient. No language interpreter was used.  Vaginal Discharge This is a new problem. The current episode started today. The problem occurs constantly. The problem has been gradually worsening. Pertinent negatives include no abdominal pain. Nothing aggravates the symptoms. She has tried nothing for the symptoms.  Pt has had bV multiple times.   Pt reports she has again.  Pt reports she gets better with flagyl but then it comes back  Past Medical History  Diagnosis Date  . Fibroids     History reviewed. No pertinent past surgical history.  History reviewed. No pertinent family history.  History  Substance Use Topics  . Smoking status: Current Every Day Smoker -- 0.50 packs/day    Types: Cigarettes  . Smokeless tobacco: Not on file  . Alcohol Use: Yes    OB History   Grav Para Term Preterm Abortions TAB SAB Ect Mult Living                  Review of Systems  Gastrointestinal: Negative for abdominal pain.  Genitourinary: Positive for vaginal discharge.  All other systems reviewed and are negative.    Allergies  Review of patient's allergies indicates no known allergies.  Home Medications   Current Outpatient Rx  Name  Route  Sig  Dispense  Refill  . fluconazole (DIFLUCAN) 150 MG tablet   Oral   Take 1 tablet (150 mg total) by mouth once.   1 tablet   1   . metroNIDAZOLE (FLAGYL) 500 MG tablet   Oral   Take 500 mg by mouth 3 (three) times daily.         . metroNIDAZOLE (FLAGYL) 500 MG tablet   Oral   Take 1 tablet (500 mg total) by mouth 2 (two) times daily.   14 tablet   0     BP  123/80  Pulse 73  Temp(Src) 98.8 F (37.1 C) (Oral)  Resp 16  Ht 5\' 4"  (1.626 m)  Wt 206 lb (93.441 kg)  BMI 35.34 kg/m2  SpO2 100%  LMP 06/25/2012  Physical Exam  Nursing note and vitals reviewed. Constitutional: She is oriented to person, place, and time. She appears well-developed and well-nourished.  HENT:  Head: Normocephalic and atraumatic.  Eyes: Pupils are equal, round, and reactive to light.  Neck: Normal range of motion.  Cardiovascular: Normal rate and normal heart sounds.   Pulmonary/Chest: Effort normal.  Abdominal: Soft.  Genitourinary: Vaginal discharge found.  Musculoskeletal: Normal range of motion.  Neurological: She is alert and oriented to person, place, and time. She has normal reflexes.  Skin: Skin is warm.  Psychiatric: She has a normal mood and affect.    ED Course  Procedures (including critical care time)  Labs Reviewed  URINALYSIS, ROUTINE W REFLEX MICROSCOPIC - Abnormal; Notable for the following:    APPearance CLOUDY (*)    Leukocytes, UA TRACE (*)    All other components within normal limits  URINE MICROSCOPIC-ADD ON - Abnormal; Notable for the following:    Squamous Epithelial / LPF FEW (*)    Bacteria, UA FEW (*)    All other components within  normal limits  URINE CULTURE  GC/CHLAMYDIA PROBE AMP  PREGNANCY, URINE   No results found.   1. BV (bacterial vaginosis)       MDM  I will try clindamycin gel to see if pt gets better results.   I advised her to see her GYn for evaluation        Elson Areas, [place] 07/16/12 1427

## 2012-07-16 NOTE — ED Notes (Signed)
Pt amb to room 6 with quick steady gait in nad. Pt reports her usual pelvic pressure and pain from her 8 known fibroids, along with approx 1 1/2 weeks of discharge and "fishy odor". Pt denies any bleeding or unusual abd pain.

## 2012-07-16 NOTE — ED Provider Notes (Signed)
Medical screening examination/treatment/procedure(s) were performed by non-physician practitioner and as supervising physician I was immediately available for consultation/collaboration.   Hanley Seamen, MD 07/16/12 1510

## 2012-07-17 LAB — GC/CHLAMYDIA PROBE AMP: GC Probe RNA: NEGATIVE

## 2012-07-18 LAB — URINE CULTURE

## 2012-07-19 ENCOUNTER — Telehealth (HOSPITAL_COMMUNITY): Payer: Self-pay | Admitting: Emergency Medicine

## 2012-07-19 NOTE — ED Notes (Signed)
Patient has +Urine culture. Checking to see if appropriately treated. °

## 2012-07-21 ENCOUNTER — Telehealth (HOSPITAL_COMMUNITY): Payer: Self-pay | Admitting: Emergency Medicine
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
## 2012-09-07 ENCOUNTER — Emergency Department (HOSPITAL_BASED_OUTPATIENT_CLINIC_OR_DEPARTMENT_OTHER): Payer: Medicaid Other

## 2012-09-07 ENCOUNTER — Emergency Department (HOSPITAL_BASED_OUTPATIENT_CLINIC_OR_DEPARTMENT_OTHER)
Admission: EM | Admit: 2012-09-07 | Discharge: 2012-09-07 | Disposition: A | Discharge status: Home / Self Care | Payer: Medicaid Other | Attending: Emergency Medicine | Admitting: Emergency Medicine

## 2012-09-07 ENCOUNTER — Encounter (HOSPITAL_BASED_OUTPATIENT_CLINIC_OR_DEPARTMENT_OTHER): Payer: Self-pay | Admitting: *Deleted

## 2012-09-07 DIAGNOSIS — N898 Other specified noninflammatory disorders of vagina: Secondary | ICD-10-CM | POA: Insufficient documentation

## 2012-09-07 DIAGNOSIS — N390 Urinary tract infection, site not specified: Secondary | ICD-10-CM | POA: Insufficient documentation

## 2012-09-07 DIAGNOSIS — B9689 Other specified bacterial agents as the cause of diseases classified elsewhere: Secondary | ICD-10-CM

## 2012-09-07 DIAGNOSIS — R109 Unspecified abdominal pain: Secondary | ICD-10-CM | POA: Insufficient documentation

## 2012-09-07 DIAGNOSIS — Z3490 Encounter for supervision of normal pregnancy, unspecified, unspecified trimester: Secondary | ICD-10-CM

## 2012-09-07 DIAGNOSIS — N760 Acute vaginitis: Secondary | ICD-10-CM | POA: Insufficient documentation

## 2012-09-07 DIAGNOSIS — Z87891 Personal history of nicotine dependence: Secondary | ICD-10-CM | POA: Insufficient documentation

## 2012-09-07 DIAGNOSIS — O210 Mild hyperemesis gravidarum: Secondary | ICD-10-CM | POA: Insufficient documentation

## 2012-09-07 DIAGNOSIS — O2390 Unspecified genitourinary tract infection in pregnancy, unspecified trimester: Secondary | ICD-10-CM | POA: Insufficient documentation

## 2012-09-07 DIAGNOSIS — Z8742 Personal history of other diseases of the female genital tract: Secondary | ICD-10-CM | POA: Insufficient documentation

## 2012-09-07 LAB — URINALYSIS, ROUTINE W REFLEX MICROSCOPIC
Bilirubin Urine: NEGATIVE
Ketones, ur: 15 mg/dL — AB
Nitrite: POSITIVE — AB
Protein, ur: NEGATIVE mg/dL
Urobilinogen, UA: 1 mg/dL (ref 0.0–1.0)
pH: 7 (ref 5.0–8.0)

## 2012-09-07 LAB — WET PREP, GENITAL

## 2012-09-07 LAB — HCG, QUANTITATIVE, PREGNANCY: hCG, Beta Chain, Quant, S: 36325 m[IU]/mL — ABNORMAL HIGH (ref <5)

## 2012-09-07 LAB — URINE MICROSCOPIC-ADD ON

## 2012-09-07 MED ORDER — METRONIDAZOLE 500 MG PO TABS: 500.0000 mg | ORAL_TABLET | Freq: Two times a day (BID) | ORAL | Status: DC

## 2012-09-07 MED ORDER — PRENATAL 27-0.8 MG PO TABS: 1.0000 | ORAL_TABLET | Freq: Every day | ORAL | Status: DC

## 2012-09-07 MED ORDER — NITROFURANTOIN MONOHYD MACRO 100 MG PO CAPS: 100.0000 mg | ORAL_CAPSULE | Freq: Two times a day (BID) | ORAL | Status: DC

## 2012-09-07 MED ORDER — NITROFURANTOIN MONOHYD MACRO 100 MG PO CAPS
100.0000 mg | ORAL_CAPSULE | Freq: Once | ORAL | Status: AC
  Administered 2012-09-07: 100 mg via ORAL
  Filled 2012-09-07: qty 1

## 2012-09-07 NOTE — ED Provider Notes (Signed)
History     CSN: [id number]  Arrival date & time 09/07/12  0901   First MD Initiated Contact with Patient 09/07/12 [phone]      Chief Complaint  Patient presents with  . Abdominal Pain    (Consider location/radiation/quality/duration/timing/severity/associated sxs/prior treatment) HPI Pt presents with lower abdominal pain.  She states the pain is sharp and intermittent.  She has also had nausea/vomiting in the mornings.  All symptoms have been present over past 1-2 weeks.  Some decreased appetite.  LMP was 3/17.  No fever, no change in stools.  Denies dysuria.  Also states she has had thick white discharge which is similar to her prior yeast infections.  No vaginal bleeding. There are no other associated systemic symptoms, there are no other alleviating or modifying factors.   Past Medical History  Diagnosis Date  . Fibroids     Past Surgical History  Procedure Laterality Date  . Induced abortion      No family history on file.  History  Substance Use Topics  . Smoking status: Former Smoker -- 0.50 packs/day    Types: Cigarettes  . Smokeless tobacco: Not on file  . Alcohol Use: Yes    OB History   Grav Para Term Preterm Abortions TAB SAB Ect Mult Living                  Review of Systems ROS reviewed and all otherwise negative except for mentioned in HPI  Allergies  Review of patient's allergies indicates no known allergies.  Home Medications   Current Outpatient Rx  Name  Route  Sig  Dispense  Refill  . clindamycin (CLEOCIN) 2 % vaginal cream   Vaginal   Place 1 Applicatorful vaginally at bedtime.   40 g   0   . fluconazole (DIFLUCAN) 150 MG tablet   Oral   Take 1 tablet (150 mg total) by mouth once.   1 tablet   1   . metroNIDAZOLE (FLAGYL) 500 MG tablet   Oral   Take 500 mg by mouth 3 (three) times daily.         . metroNIDAZOLE (FLAGYL) 500 MG tablet   Oral   Take 1 tablet (500 mg total) by mouth 2 (two) times daily.   14 tablet   0   .  metroNIDAZOLE (FLAGYL) 500 MG tablet   Oral   Take 1 tablet (500 mg total) by mouth 2 (two) times daily.   10 tablet   0   . nitrofurantoin, macrocrystal-monohydrate, (MACROBID) 100 MG capsule   Oral   Take 1 capsule (100 mg total) by mouth 2 (two) times daily.   10 capsule   0   . Prenatal Vit-Fe Fumarate-FA (MULTIVITAMIN-PRENATAL) 27-0.8 MG TABS   Oral   Take 1 tablet by mouth daily at 12 noon.   30 each   0     BP 106/64  Pulse 74  Temp(Src) 97.5 F (36.4 C)  Resp 18  SpO2 99%  LMP 08/16/2012 Vitals reviewed Physical Exam Physical Examination: General appearance - alert, well appearing, and in no distress Mental status - alert, oriented to person, place, and time Eyes - no conjunctival injection, no scleral icterus Mouth - mucous membranes moist, pharynx normal without lesions Chest - clear to auscultation, no wheezes, rales or rhonchi, symmetric air entry Heart - normal rate, regular rhythm, normal S1, S2, no murmurs, rubs, clicks or gallops Abdomen - soft, bilateral lower abdominal tenderness to palpation, no gaurding or  rebound, nondistended, no masses or organomegaly Pelvic - normal external genitalia, vulva, vagina, cervix, uterus and adnexa, mild ttp diffusely over adnexa bilaterally and fundus Extremities - peripheral pulses normal, no pedal edema, no clubbing or cyanosis Skin - normal coloration and turgor, no rashes  ED Course  Procedures (including critical care time)  Labs Reviewed  WET PREP, GENITAL - Abnormal; Notable for the following:    Clue Cells Wet Prep HPF POC TOO NUMEROUS TO COUNT (*)    WBC, Wet Prep HPF POC FEW (*)    All other components within normal limits  URINALYSIS, ROUTINE W REFLEX MICROSCOPIC - Abnormal; Notable for the following:    Color, Urine AMBER (*)    APPearance CLOUDY (*)    Specific Gravity, Urine 1.034 (*)    Ketones, ur 15 (*)    Nitrite POSITIVE (*)    Leukocytes, UA MODERATE (*)    All other components within  normal limits  PREGNANCY, URINE - Abnormal; Notable for the following:    Preg Test, Ur POSITIVE (*)    All other components within normal limits  URINE MICROSCOPIC-ADD ON - Abnormal; Notable for the following:    Squamous Epithelial / LPF FEW (*)    Bacteria, UA MANY (*)    All other components within normal limits  HCG, QUANTITATIVE, PREGNANCY - Abnormal; Notable for the following:    hCG, Beta Chain, Quant, [place] [address] (*)    All other components within normal limits  GC/CHLAMYDIA PROBE AMP  URINE CULTURE   US Ob Comp Less 14 Wks  09/07/2012  *RADIOLOGY REPORT*  Clinical Data: Lower abdominal pain.  No quantitative beta HCG available.  OBSTETRIC <14 WK [place] AND TRANSVAGINAL OB US  Technique:  Both transabdominal and transvaginal ultrasound examinations were performed for complete evaluation of the gestation as well as the maternal uterus, adnexal regions, and pelvic cul-de-sac.  Transvaginal technique was performed to assess early pregnancy.  Comparison:  01/17/2012.  Intrauterine gestational sac:  Visualized/normal in shape. Yolk sac: Present Embryo: Present Cardiac Activity: Present Heart Rate: 114 bpm  CRL: 8.4  mm  6 w  6 d        [place] EDC: 04/27/2013.  Maternal uterus/adnexae: Myometrial fibroids are present.  These were identified on prior ultrasound and shows some interval growth.  No free fluid. Physiologic appearance of the ovaries with left corpus luteum cyst. Right ovary is normal.  IMPRESSION: Uncomplicated single intrauterine pregnancy.   Original Report Authenticated By: Andreas Newport, M.D.    US Ob Transvaginal  09/07/2012  *RADIOLOGY REPORT*  Clinical Data: Lower abdominal pain.  No quantitative beta HCG available.  OBSTETRIC <14 WK [place] AND TRANSVAGINAL OB US  Technique:  Both transabdominal and transvaginal ultrasound examinations were performed for complete evaluation of the gestation as well as the maternal uterus, adnexal regions, and pelvic cul-de-sac.  Transvaginal technique was  performed to assess early pregnancy.  Comparison:  01/17/2012.  Intrauterine gestational sac:  Visualized/normal in shape. Yolk sac: Present Embryo: Present Cardiac Activity: Present Heart Rate: 114 bpm  CRL: 8.4  mm  6 w  6 d        [place] EDC: 04/27/2013.  Maternal uterus/adnexae: Myometrial fibroids are present.  These were identified on prior ultrasound and shows some interval growth.  No free fluid. Physiologic appearance of the ovaries with left corpus luteum cyst. Right ovary is normal.  IMPRESSION: Uncomplicated single intrauterine pregnancy.   Original Report Authenticated By: Andreas Newport, M.D.  1. Pregnancy   2. Urinary tract infection   3. Bacterial vaginosis       MDM  Pt presenting with lower adominal pain.  Pt diagnosed with pregnancy in the ED, IUP seen on ultrasound.  BV with clue cells on wet prep.  Also found to have UTI.  Will start on macrobid as well as flagyl.  Pt also started on prenatal vitamins and given info for OB followup.  Discharged with strict return precautions.  Pt agreeable with plan.        Ethelda Chick, MD 09/07/12 1145

## 2012-09-07 NOTE — ED Notes (Signed)
Patient c/o sharp lower mid abd pain for the past 1.5 weeks, nausea and vomiting 2 days ago, appetite suppressed, no meds taken LMP ended 3/17

## 2012-09-08 LAB — GC/CHLAMYDIA PROBE AMP: GC Probe RNA: NEGATIVE

## 2012-09-09 LAB — URINE CULTURE: Colony Count: >=100000

## 2012-09-10 ENCOUNTER — Telehealth (HOSPITAL_COMMUNITY): Payer: Self-pay | Admitting: Emergency Medicine

## 2012-09-13 ENCOUNTER — Telehealth (HOSPITAL_COMMUNITY): Payer: Self-pay | Admitting: Emergency Medicine

## 2012-09-22 ENCOUNTER — Encounter (HOSPITAL_BASED_OUTPATIENT_CLINIC_OR_DEPARTMENT_OTHER): Payer: Self-pay | Admitting: Emergency Medicine

## 2012-09-22 ENCOUNTER — Inpatient Hospital Stay (HOSPITAL_BASED_OUTPATIENT_CLINIC_OR_DEPARTMENT_OTHER)
Admission: EM | Admit: 2012-09-22 | Discharge: 2012-09-22 | DRG: 781 | Disposition: A | Discharge status: Home / Self Care | Payer: Medicaid Other | Attending: Emergency Medicine | Admitting: Emergency Medicine

## 2012-09-22 DIAGNOSIS — O210 Mild hyperemesis gravidarum: Principal | ICD-10-CM | POA: Diagnosis present

## 2012-09-22 DIAGNOSIS — N390 Urinary tract infection, site not specified: Secondary | ICD-10-CM | POA: Diagnosis present

## 2012-09-22 DIAGNOSIS — O2390 Unspecified genitourinary tract infection in pregnancy, unspecified trimester: Secondary | ICD-10-CM | POA: Diagnosis present

## 2012-09-22 DIAGNOSIS — O219 Vomiting of pregnancy, unspecified: Secondary | ICD-10-CM

## 2012-09-22 DIAGNOSIS — Z87891 Personal history of nicotine dependence: Secondary | ICD-10-CM

## 2012-09-22 LAB — COMPREHENSIVE METABOLIC PANEL
AST: 17 U/L (ref 0–37)
Albumin: 3.9 g/dL (ref 3.5–5.2)
Alkaline Phosphatase: 53 U/L (ref 39–117)
BUN: 10 mg/dL (ref 6–23)
CO2: 24 mEq/L (ref 19–32)
Chloride: 100 mEq/L (ref 96–112)
Creatinine, Ser: 0.8 mg/dL (ref 0.50–1.10)
GFR calc non Af Amer: >90 mL/min (ref >90)
Potassium: 3.6 mEq/L (ref 3.5–5.1)
Total Bilirubin: 0.5 mg/dL (ref 0.3–1.2)

## 2012-09-22 LAB — URINALYSIS, ROUTINE W REFLEX MICROSCOPIC
Glucose, UA: NEGATIVE mg/dL
Ketones, ur: 40 mg/dL — AB
Protein, ur: 100 mg/dL — AB
Urobilinogen, UA: 2 mg/dL — ABNORMAL HIGH (ref 0.0–1.0)

## 2012-09-22 LAB — CBC WITH DIFFERENTIAL/PLATELET
Basophils Relative: 0 % (ref 0–1)
HCT: 41 % (ref 36.0–46.0)
Hemoglobin: 14.9 g/dL (ref 12.0–15.0)
Lymphocytes Relative: 26 % (ref 12–46)
MCHC: 36.3 g/dL — ABNORMAL HIGH (ref 30.0–36.0)
Monocytes Absolute: 0.6 10*3/uL (ref 0.1–1.0)
Monocytes Relative: 7 % (ref 3–12)
Neutro Abs: 5.2 10*3/uL (ref 1.7–7.7)
Neutrophils Relative %: 66 % (ref 43–77)
RBC: 4.7 MIL/uL (ref 3.87–5.11)
WBC: 7.9 10*3/uL (ref 4.0–10.5)

## 2012-09-22 LAB — URINE MICROSCOPIC-ADD ON

## 2012-09-22 MED ORDER — DEXTROSE 5 % IV SOLN
INTRAVENOUS | Status: AC
  Filled 2012-09-22: qty 50

## 2012-09-22 MED ORDER — SODIUM CHLORIDE 0.9 % IV BOLUS (SEPSIS)
1000.0000 mL | Freq: Once | INTRAVENOUS | Status: AC
  Administered 2012-09-22: 1000 mL via INTRAVENOUS

## 2012-09-22 MED ORDER — ONDANSETRON 8 MG PO TBDP: 8.0000 mg | ORAL_TABLET | Freq: Three times a day (TID) | ORAL | Status: DC | PRN

## 2012-09-22 MED ORDER — CEFTRIAXONE SODIUM 1 G IJ SOLR
1.0000 g | Freq: Once | INTRAMUSCULAR | Status: AC
  Administered 2012-09-22: 1 g via INTRAVENOUS
  Filled 2012-09-22: qty 10

## 2012-09-22 MED ORDER — ONDANSETRON HCL 4 MG/2ML IJ SOLN
4.0000 mg | Freq: Once | INTRAMUSCULAR | Status: AC
  Administered 2012-09-22: 4 mg via INTRAVENOUS
  Filled 2012-09-22: qty 2

## 2012-09-22 NOTE — ED Provider Notes (Signed)
History     CSN: [id number]  Arrival date & time 09/22/12  0920   First MD Initiated Contact with Patient 09/22/12 [phone]      Chief Complaint  Patient presents with  . Emesis  . Diarrhea    (Consider location/radiation/quality/duration/timing/severity/associated sxs/prior treatment) HPI  Patient complaining of nausea vomiting and diarrhea with 3 loose stools per day for three days.  She was seen here 4/19 and pregnancy test postiive at that time. She was found to have bacterial vaginosis and urinary tract infection. She was started on Bactrim and Flagyl. She was called and told to stop taking the Flagyl. She is on day 3 of the Bactrim. She denies any fever although she feels that she has had some chills. She is not currently having any abdominal pain. She's been taking by mouth with vomiting for the past 2 days. She denies lightheadedness. She denies any headache, chest pain, abdominal pain, change in vaginal discharge, increased frequency of urination but she does have some dysuria. Past Medical History  Diagnosis Date  . Fibroids     Past Surgical History  Procedure Laterality Date  . Induced abortion      No family history on file.  History  Substance Use Topics  . Smoking status: Former Smoker -- 0.50 packs/day    Types: Cigarettes  . Smokeless tobacco: Not on file  . Alcohol Use: Yes    OB History   Grav Para Term Preterm Abortions TAB SAB Ect Mult Living                  Review of Systems  All other systems reviewed and are negative.    Allergies  Review of patient's allergies indicates no known allergies.  Home Medications   Current Outpatient Rx  Name  Route  Sig  Dispense  Refill  . sulfamethoxazole-trimethoprim (BACTRIM DS) 800-160 MG per tablet   Oral   Take 1 tablet by mouth 2 (two) times daily.         . clindamycin (CLEOCIN) 2 % vaginal cream   Vaginal   Place 1 Applicatorful vaginally at bedtime.   40 g   0   . metroNIDAZOLE (FLAGYL)  500 MG tablet   Oral   Take 1 tablet (500 mg total) by mouth 2 (two) times daily.   10 tablet   0   . nitrofurantoin, macrocrystal-monohydrate, (MACROBID) 100 MG capsule   Oral   Take 1 capsule (100 mg total) by mouth 2 (two) times daily.   10 capsule   0   . Prenatal Vit-Fe Fumarate-FA (MULTIVITAMIN-PRENATAL) 27-0.8 MG TABS   Oral   Take 1 tablet by mouth daily at 12 noon.   30 each   0     BP 134/79  Pulse 91  Temp(Src) 98.5 F (36.9 C)  Resp 16  SpO2 100%  LMP 07/19/2012  Physical Exam  Nursing note and vitals reviewed. Constitutional: She is oriented to person, place, and time. She appears well-developed and well-nourished.  HENT:  Head: Normocephalic and atraumatic.  Eyes: Conjunctivae are normal. Pupils are equal, round, and reactive to light.  Neck: Normal range of motion. Neck supple.  Cardiovascular: Normal rate, regular rhythm, normal heart sounds and intact distal pulses.   Pulmonary/Chest: Effort normal and breath sounds normal.  Abdominal: Bowel sounds are normal.  Musculoskeletal: Normal range of motion.  Neurological: She is alert and oriented to person, place, and time. She has normal reflexes.  Skin: Skin is warm and  dry.  Psychiatric: She has a normal mood and affect. Her behavior is normal. Judgment and thought content normal.    ED Course  Procedures (including critical care time)  Labs Reviewed  CBC WITH DIFFERENTIAL - Abnormal; Notable for the following:    MCHC 36.3 (*)    All other components within normal limits  URINALYSIS, ROUTINE W REFLEX MICROSCOPIC - Abnormal; Notable for the following:    Color, Urine AMBER (*)    APPearance TURBID (*)    Specific Gravity, Urine 1.034 (*)    Hgb urine dipstick MODERATE (*)    Bilirubin Urine SMALL (*)    Ketones, ur 40 (*)    Protein, ur 100 (*)    Urobilinogen, UA 2.0 (*)    Leukocytes, UA LARGE (*)    All other components within normal limits  PREGNANCY, URINE - Abnormal; Notable for the  following:    Preg Test, Ur POSITIVE (*)    All other components within normal limits  URINE MICROSCOPIC-ADD ON - Abnormal; Notable for the following:    Squamous Epithelial / LPF MANY (*)    Bacteria, UA MANY (*)    All other components within normal limits  URINE CULTURE  COMPREHENSIVE METABOLIC PANEL   No results found.   No diagnosis found.    MDM  Patient is hydrated here with a liter of normal saline and given Zofran 4 mg IV. She has not vomited here say she feels improved. She is taking fluids without vomiting. She is given 1 g of Rocephin. I discussed with her that she needs to complete all of her antibiotics. She is also to get her metronidazole filled and to take bad as it is unclear why she was stopped. Her previous wet prep was significant for bacterial vaginosis.       Hilario Quarry, MD 09/22/12 [phone]

## 2012-09-22 NOTE — ED Notes (Signed)
Pt having N/V/D for several days.  Pt is [redacted] weeks pregnant.  Currently treated for UTI.

## 2012-09-24 LAB — URINE CULTURE

## 2012-11-13 LAB — OB RESULTS CONSOLE RPR: RPR: NONREACTIVE

## 2012-11-13 LAB — OB RESULTS CONSOLE HEPATITIS B SURFACE ANTIGEN: Hepatitis B Surface Ag: NEGATIVE

## 2012-11-13 LAB — OB RESULTS CONSOLE RUBELLA ANTIBODY, IGM: Rubella: IMMUNE

## 2012-11-13 LAB — OB RESULTS CONSOLE GC/CHLAMYDIA
Chlamydia: NEGATIVE
Gonorrhea: NEGATIVE

## 2012-11-27 ENCOUNTER — Inpatient Hospital Stay (HOSPITAL_COMMUNITY)
Admission: AD | Admit: 2012-11-27 | Discharge: 2012-11-27 | Disposition: A | Discharge status: Home / Self Care | Payer: Medicaid Other | Source: Ambulatory Visit | Attending: Obstetrics and Gynecology | Admitting: Obstetrics and Gynecology

## 2012-11-27 ENCOUNTER — Encounter (HOSPITAL_COMMUNITY): Payer: Self-pay

## 2012-11-27 DIAGNOSIS — R300 Dysuria: Secondary | ICD-10-CM | POA: Insufficient documentation

## 2012-11-27 DIAGNOSIS — O210 Mild hyperemesis gravidarum: Secondary | ICD-10-CM | POA: Insufficient documentation

## 2012-11-27 DIAGNOSIS — R109 Unspecified abdominal pain: Secondary | ICD-10-CM | POA: Insufficient documentation

## 2012-11-27 DIAGNOSIS — O99891 Other specified diseases and conditions complicating pregnancy: Secondary | ICD-10-CM | POA: Insufficient documentation

## 2012-11-27 LAB — URINALYSIS, ROUTINE W REFLEX MICROSCOPIC
Glucose, UA: NEGATIVE mg/dL
Nitrite: NEGATIVE
Protein, ur: NEGATIVE mg/dL
Urobilinogen, UA: 2 mg/dL — ABNORMAL HIGH (ref 0.0–1.0)

## 2012-11-27 LAB — WET PREP, GENITAL: Trich, Wet Prep: NONE SEEN

## 2012-11-27 LAB — URINE MICROSCOPIC-ADD ON

## 2012-11-27 MED ORDER — IBUPROFEN 200 MG PO TABS: 600.0000 mg | ORAL_TABLET | Freq: Four times a day (QID) | ORAL | Status: AC | PRN

## 2012-11-27 NOTE — MAU Note (Signed)
Pt states here for lower abdominal pain, is intermittent, feels like sharp cramps, negative for CVA tenderness. Has had difficulty going to restroom past 2 days, urine very dark colored. Thinks she has BV, gets recurrent bv. Notes vaginal odor. Frequency and urgency with voiding, however has a hard time actually going. Pain is suprapubic only.

## 2012-11-27 NOTE — MAU Provider Note (Signed)
History   22yo G2P0010 at [redacted]w[redacted]d presents to MAU with lower abdominal cramping for the last 3 days - tried Tylenol with very little relief.  Also reports lower abdominal discomfort when her bladder is full, she voids a small amount and feels like her bladder does not fully empty x 2 days.  She reports loose green stool yesterday.  Pt concerned she may have BV.  Reports some N/V.  Denies VB, LOF, recent fever, resp or GI c/o's, UTI or PIH s/s. No one sick around her.  Chief Complaint  Patient presents with  . Dysuria  . Emesis During Pregnancy  . Abdominal Pain    OB History   Grav Para Term Preterm Abortions TAB SAB Ect Mult Living   2    1 1           Past Medical History  Diagnosis Date  . Fibroids     Past Surgical History  Procedure Laterality Date  . Induced abortion      History reviewed. No pertinent family history.  History  Substance Use Topics  . Smoking status: Former Smoker -- 0.50 packs/day    Types: Cigarettes  . Smokeless tobacco: Never Used  . Alcohol Use: No    Allergies: Not on File  Prescriptions prior to admission  Medication Sig Dispense Refill  . Prenatal Vit-Fe Fumarate-FA (PRENATAL MULTIVITAMIN) TABS Take 1 tablet by mouth daily at 12 noon.        ROS: see HPI above, all other systems are negative   Physical Exam   Blood pressure 93/63, pulse 91, temperature 98.5 F (36.9 C), temperature source Oral, resp. rate 20, height 5\' 5"  (1.651 m), weight 205 lb 12.8 oz (93.35 kg), last menstrual period 07/19/2012, SpO2 100.00%.  Chest: Clear Heart: RRR Abdomen: gravid, NT Extremities: WNL  Pelvic exam: normal external genitalia, vulva, vagina, cervix, uterus and adnexa.  FHT: Doppler 154  ED Course  IUP at [redacted]w[redacted]d Abdominal cramping  Urine culture pending Wet prep GC/CT culture pending  D/c home with precautions 24 hours of Motrin 600 mg Q6 hrs F/u with office if sx worsen and at next ROB 12/12/12   Gina Burns CNM, MSN 11/27/2012  10:57 AM

## 2012-11-27 NOTE — MAU Note (Signed)
Patient states she has had pain with urination and lower abdominal pain for the past 2 days. States she has nausea and vomiting off and on. Has a heavy clear discharge but no bleeding.

## 2012-11-28 LAB — URINE CULTURE: Colony Count: 60000

## 2013-04-14 LAB — OB RESULTS CONSOLE GBS: GBS: POSITIVE

## 2013-04-28 ENCOUNTER — Inpatient Hospital Stay (HOSPITAL_COMMUNITY)
Admission: AD | Admit: 2013-04-28 | Discharge: 2013-04-28 | Disposition: A | Discharge status: Home / Self Care | Payer: Medicaid Other | Source: Ambulatory Visit | Attending: Obstetrics and Gynecology | Admitting: Obstetrics and Gynecology

## 2013-04-28 ENCOUNTER — Encounter (HOSPITAL_COMMUNITY): Payer: Self-pay

## 2013-04-28 DIAGNOSIS — M549 Dorsalgia, unspecified: Secondary | ICD-10-CM | POA: Insufficient documentation

## 2013-04-28 DIAGNOSIS — D259 Leiomyoma of uterus, unspecified: Secondary | ICD-10-CM | POA: Insufficient documentation

## 2013-04-28 DIAGNOSIS — R109 Unspecified abdominal pain: Secondary | ICD-10-CM | POA: Insufficient documentation

## 2013-04-28 DIAGNOSIS — N949 Unspecified condition associated with female genital organs and menstrual cycle: Secondary | ICD-10-CM | POA: Insufficient documentation

## 2013-04-28 LAB — URINALYSIS, ROUTINE W REFLEX MICROSCOPIC
Bilirubin Urine: NEGATIVE
Hgb urine dipstick: NEGATIVE
Specific Gravity, Urine: 1.025 (ref 1.005–1.030)
pH: 7 (ref 5.0–8.0)

## 2013-04-28 NOTE — MAU Note (Signed)
Pt states has fibroids. Pain feels like fibroid pain, cramping, and back pain.

## 2013-05-02 ENCOUNTER — Encounter (HOSPITAL_COMMUNITY): Payer: Medicaid Other | Admitting: Anesthesiology

## 2013-05-02 ENCOUNTER — Telehealth (HOSPITAL_COMMUNITY): Payer: Self-pay | Admitting: *Deleted

## 2013-05-02 ENCOUNTER — Inpatient Hospital Stay (HOSPITAL_COMMUNITY)
Admission: AD | Admit: 2013-05-02 | Discharge: 2013-05-05 | DRG: 775 | Disposition: A | Discharge status: Home / Self Care | Payer: Medicaid Other | Source: Ambulatory Visit | Attending: Obstetrics and Gynecology | Admitting: Obstetrics and Gynecology

## 2013-05-02 ENCOUNTER — Encounter (HOSPITAL_COMMUNITY): Payer: Self-pay | Admitting: *Deleted

## 2013-05-02 ENCOUNTER — Inpatient Hospital Stay (HOSPITAL_COMMUNITY): Payer: Medicaid Other | Admitting: Anesthesiology

## 2013-05-02 DIAGNOSIS — D509 Iron deficiency anemia, unspecified: Secondary | ICD-10-CM | POA: Diagnosis present

## 2013-05-02 DIAGNOSIS — Z22330 Carrier of Group B streptococcus: Secondary | ICD-10-CM

## 2013-05-02 DIAGNOSIS — O0930 Supervision of pregnancy with insufficient antenatal care, unspecified trimester: Secondary | ICD-10-CM | POA: Insufficient documentation

## 2013-05-02 DIAGNOSIS — D259 Leiomyoma of uterus, unspecified: Secondary | ICD-10-CM | POA: Diagnosis present

## 2013-05-02 DIAGNOSIS — G43909 Migraine, unspecified, not intractable, without status migrainosus: Secondary | ICD-10-CM

## 2013-05-02 DIAGNOSIS — O9902 Anemia complicating childbirth: Secondary | ICD-10-CM | POA: Diagnosis present

## 2013-05-02 DIAGNOSIS — D251 Intramural leiomyoma of uterus: Secondary | ICD-10-CM | POA: Diagnosis present

## 2013-05-02 DIAGNOSIS — O99892 Other specified diseases and conditions complicating childbirth: Secondary | ICD-10-CM | POA: Diagnosis present

## 2013-05-02 DIAGNOSIS — Z8614 Personal history of Methicillin resistant Staphylococcus aureus infection: Secondary | ICD-10-CM | POA: Insufficient documentation

## 2013-05-02 DIAGNOSIS — D4959 Neoplasm of unspecified behavior of other genitourinary organ: Secondary | ICD-10-CM | POA: Diagnosis present

## 2013-05-02 DIAGNOSIS — D219 Benign neoplasm of connective and other soft tissue, unspecified: Secondary | ICD-10-CM | POA: Diagnosis present

## 2013-05-02 DIAGNOSIS — O34599 Maternal care for other abnormalities of gravid uterus, unspecified trimester: Secondary | ICD-10-CM | POA: Diagnosis present

## 2013-05-02 DIAGNOSIS — O99820 Streptococcus B carrier state complicating pregnancy: Secondary | ICD-10-CM

## 2013-05-02 HISTORY — DX: Migraine, unspecified, not intractable, without status migrainosus: G43.909

## 2013-05-02 HISTORY — DX: Iron deficiency anemia, unspecified: D50.9

## 2013-05-02 LAB — CBC
HCT: 35.8 % — ABNORMAL LOW (ref 36.0–46.0)
MCHC: 34.4 g/dL (ref 30.0–36.0)
Platelets: 272 10*3/uL (ref 150–400)
RBC: 4.35 MIL/uL (ref 3.87–5.11)
RDW: 13.3 % (ref 11.5–15.5)
WBC: 14.1 10*3/uL — ABNORMAL HIGH (ref 4.0–10.5)

## 2013-05-02 MED ORDER — PENICILLIN G POTASSIUM 5000000 UNITS IJ SOLR
5.0000 10*6.[IU] | Freq: Once | INTRAVENOUS | Status: AC
  Administered 2013-05-02: 5 10*6.[IU] via INTRAVENOUS
  Filled 2013-05-02: qty 5

## 2013-05-02 MED ORDER — LACTATED RINGERS IV SOLN
INTRAVENOUS | Status: DC
  Administered 2013-05-02: 23:00:00 via INTRAVENOUS

## 2013-05-02 MED ORDER — DIPHENHYDRAMINE HCL 50 MG/ML IJ SOLN: 12.5000 mg | INTRAMUSCULAR | Status: DC | PRN

## 2013-05-02 MED ORDER — PHENYLEPHRINE 40 MCG/ML (10ML) SYRINGE FOR IV PUSH (FOR BLOOD PRESSURE SUPPORT)
80.0000 ug | PREFILLED_SYRINGE | INTRAVENOUS | Status: DC | PRN
  Filled 2013-05-02: qty 2

## 2013-05-02 MED ORDER — LACTATED RINGERS IV SOLN
500.0000 mL | Freq: Once | INTRAVENOUS | Status: AC
  Administered 2013-05-03: 500 mL via INTRAVENOUS

## 2013-05-02 MED ORDER — FENTANYL 2.5 MCG/ML BUPIVACAINE 1/10 % EPIDURAL INFUSION (WH - ANES)
14.0000 mL/h | INTRAMUSCULAR | Status: DC | PRN
  Administered 2013-05-02: 14 mL/h via EPIDURAL
  Filled 2013-05-02: qty 125

## 2013-05-02 MED ORDER — EPHEDRINE 5 MG/ML INJ
10.0000 mg | INTRAVENOUS | Status: DC | PRN
  Filled 2013-05-02: qty 2

## 2013-05-02 MED ORDER — IBUPROFEN 600 MG PO TABS: 600.0000 mg | ORAL_TABLET | Freq: Four times a day (QID) | ORAL | Status: DC | PRN

## 2013-05-02 MED ORDER — LIDOCAINE HCL (PF) 1 % IJ SOLN
INTRAMUSCULAR | Status: DC | PRN
  Administered 2013-05-02 (×2): 5 mL

## 2013-05-02 MED ORDER — ACETAMINOPHEN 325 MG PO TABS: 650.0000 mg | ORAL_TABLET | ORAL | Status: DC | PRN

## 2013-05-02 MED ORDER — OXYTOCIN BOLUS FROM INFUSION
500.0000 mL | INTRAVENOUS | Status: DC
  Administered 2013-05-03: 500 mL via INTRAVENOUS

## 2013-05-02 MED ORDER — LACTATED RINGERS IV SOLN: 500.0000 mL | INTRAVENOUS | Status: DC | PRN

## 2013-05-02 MED ORDER — FLEET ENEMA 7-19 GM/118ML RE ENEM: 1.0000 | ENEMA | RECTAL | Status: DC | PRN

## 2013-05-02 MED ORDER — OXYTOCIN 40 UNITS IN LACTATED RINGERS INFUSION - SIMPLE MED
62.5000 mL/h | INTRAVENOUS | Status: DC
  Filled 2013-05-02: qty 1000

## 2013-05-02 MED ORDER — PHENYLEPHRINE 40 MCG/ML (10ML) SYRINGE FOR IV PUSH (FOR BLOOD PRESSURE SUPPORT)
80.0000 ug | PREFILLED_SYRINGE | INTRAVENOUS | Status: DC | PRN
  Filled 2013-05-02: qty 2
  Filled 2013-05-02: qty 10

## 2013-05-02 MED ORDER — CITRIC ACID-SODIUM CITRATE 334-500 MG/5ML PO SOLN: 30.0000 mL | ORAL | Status: DC | PRN

## 2013-05-02 MED ORDER — BUTORPHANOL TARTRATE 1 MG/ML IJ SOLN: 1.0000 mg | INTRAMUSCULAR | Status: DC | PRN

## 2013-05-02 MED ORDER — ONDANSETRON HCL 4 MG/2ML IJ SOLN
4.0000 mg | Freq: Four times a day (QID) | INTRAMUSCULAR | Status: DC | PRN
  Administered 2013-05-03: 4 mg via INTRAVENOUS
  Filled 2013-05-02: qty 2

## 2013-05-02 MED ORDER — LIDOCAINE HCL (PF) 1 % IJ SOLN
30.0000 mL | INTRAMUSCULAR | Status: AC | PRN
  Administered 2013-05-03: 30 mL via SUBCUTANEOUS
  Filled 2013-05-02 (×2): qty 30

## 2013-05-02 MED ORDER — EPHEDRINE 5 MG/ML INJ
10.0000 mg | INTRAVENOUS | Status: DC | PRN
  Filled 2013-05-02: qty 4
  Filled 2013-05-02: qty 2

## 2013-05-02 MED ORDER — PENICILLIN G POTASSIUM 5000000 UNITS IJ SOLR
2.5000 10*6.[IU] | INTRAVENOUS | Status: DC
  Filled 2013-05-02 (×4): qty 2.5

## 2013-05-02 MED ORDER — OXYCODONE-ACETAMINOPHEN 5-325 MG PO TABS: 1.0000 | ORAL_TABLET | ORAL | Status: DC | PRN

## 2013-05-02 NOTE — Anesthesia Preprocedure Evaluation (Signed)
Anesthesia Evaluation  Patient identified by MRN, date of birth, ID band Patient awake    Reviewed: Allergy & Precautions, H&P , Patient's Chart, lab work & pertinent test results  Airway Mallampati: III TM Distance: >3 FB Neck ROM: full    Dental   Pulmonary former smoker,  breath sounds clear to auscultation        Cardiovascular Rhythm:regular Rate:Normal     Neuro/Psych  Headaches,    GI/Hepatic   Endo/Other  Morbid obesity  Renal/GU      Musculoskeletal   Abdominal   Peds  Hematology  (+) anemia ,   Anesthesia Other Findings   Reproductive/Obstetrics (+) Pregnancy                           Anesthesia Physical Anesthesia Plan  ASA: III  Anesthesia Plan: Epidural   Post-op Pain Management:    Induction:   Airway Management Planned:   Additional Equipment:   Intra-op Plan:   Post-operative Plan:   Informed Consent: I have reviewed the patients History and Physical, chart, labs and discussed the procedure including the risks, benefits and alternatives for the proposed anesthesia with the patient or authorized representative who has indicated his/her understanding and acceptance.     Plan Discussed with:   Anesthesia Plan Comments:         Anesthesia Quick Evaluation

## 2013-05-02 NOTE — H&P (Signed)
Gina Burns is a 22 y.o. female, G2P0 at 23 weeks, presenting with c/o increasing contraction frequency and strength since office visit earlier today--cervix 1 cm, 90% by exam.  Had one episode of "feeling wet" at 3pm, but no active leaking.  Reports +FM, denies bleeding.  Patient Active Problem List   Diagnosis Date Noted  . Hx MRSA infection--2012 on thigh, treated in [place] 05/02/2013  . GBS (group B Streptococcus carrier), +RV culture, currently pregnant 05/02/2013  . Fibroids 05/02/2013  . Anemia, iron deficiency 05/02/2013  . Migraine, unspecified, without mention of intractable migraine without mention of status migrainosus 05/02/2013  . Late prenatal care--at 17 weeks 05/02/2013    History of present pregnancy: Patient entered care at 17 2/7 weeks.   EDC of 04/25/13 was established by LMP and in agreement with [place] at 6 6/7 weeks--fibroids were noted at that time, stable based on prior US before pregnancy..   Anatomy scan:  20 weeks, with normal findings and an anterior placenta.  2 fibroids were noted--posterior and anterior intramural, stable. Additional [place] evaluations:  12/3--EFW 7+3, 25%ile, AFI 15.53, 60%ile, vtx.  Posterior IM fibroid 4.1 x 4.8 x 3.9 (stable), anterior fibroid not seen. BPP +NST today = 10/10 Significant prenatal events:  Received TDaP 9/25, flu vaccine 11/5.  Had some dizziness at 35 weeks, normal CBC and CMP.  Seen in MAU at 18 weeks for pelvic pain, with negative cultures and w/u. Last evaluation:  Today, cervix 1 cm, 90% by L. Carter exam. BPP/NST 10/10  OB History   Grav Para Term Preterm Abortions TAB SAB Ect Mult Living   2    1 1         2011--TAB in early pregnancy  Past Medical History  Diagnosis Date  . Fibroids   . Hx of varicella   . [id number](914.7)    Past Surgical History  Procedure Laterality Date  . Induced abortion     Family History: Mother HTN, elevated cholesterol; Brother HTN, elevated cholesterol; MGM HTN, diabetes, elevated  cholesterol; MGF lung CA Social History:  reports that she has quit smoking. Her smoking use included Cigarettes. She smoked 0.50 packs per day. She has never used smokeless tobacco. She reports that she does not drink alcohol or use illicit drugs.  FOB is not involved--patient is supported by her mother and grandmother, who are both present with her today.  Patient is a Archivist.   Prenatal Transfer Tool  Maternal Diabetes: No Genetic Screening: Normal Quad screen Maternal Ultrasounds/Referrals: Normal Fetal Ultrasounds or other Referrals:  None Maternal Substance Abuse:  No Significant Maternal Medications:  None Significant Maternal Lab Results: Lab values include: Group B Strep positive    ROS:  Contractions, +FM.  No Known Allergies   Dilation: 2 Effacement (%): 100 Station: -2;-1 Exam by:: V.Raynaldo Falco,CNM Blood pressure 111/75, pulse 91, temperature 98.3 F (36.8 C), resp. rate 20, height 5\' 4"  (1.626 m), weight 250 lb 3.2 oz (113.49 kg), last menstrual period 07/19/2012.  Chest clear Heart RRR without murmur Abd gravid, NT, FH 40 cm Pelvic: As above, membranes intact Ext: WNL  FHR: Category 2--baseline 155-165, mild/moderate variables, some with UCs, some spontaneous, moderate variability, sporadic accels. UCs:  q 3-5 min, moderate  Prenatal labs: ABO, Rh: A/Positive/-- (06/25 0000) Antibody: Negative (06/25 0000) Rubella:   Immune RPR: Nonreactive (06/25 0000)  HBsAg: Negative (06/25 0000)  HIV: Non-reactive (06/25 0000)  GBS: Positive (11/24 0000) Sickle cell/Hgb electrophoresis:  Negative Pap:  2010 GC:  Neg in  July and at 35 weeks Chlamydia:  Neg in July and at 35 weeks Genetic screenings:  Normal Quad screen Glucola:  WNL Other:  Hgb 12.6 at NOB, 12.1 at 28 weeks, and 11.7 at 36 weeks    Assessment/Plan: IUP at 41 weeks Early labor Positive GBS Sporadic variables Remote hx MRSA outside [facility]  Plan: Admit to [facility] per consult  with Dr. Estanislado Pandy Routine [facility] orders GBS prophylaxis with PCN G Plan epidural anesthesia MRSA isolation--MRSA testing done.  If negative, will clear from isolation. Close observation of FHR status.   Nyra Capes, [place] 05/02/2013, 10:34 PM

## 2013-05-02 NOTE — Progress Notes (Signed)
  Subjective: Now on Birthing Suite--family at bedside.  Breathing with contractions, very uncomfortable in back.  Objective: BP 109/66  Pulse 77  Temp(Src) 97.8 F (36.6 C) (Oral)  Resp 20  Ht 5\' 4"  (1.626 m)  Wt 250 lb 3.2 oz (113.49 kg)  BMI 42.93 kg/m2  LMP 07/19/2012    1st dose PCN infusing.  FHT: Category 2--baseline 160-170, mild/moderate variables with contractions, moderate variability. UC:   regular, every 3-4 minutes SVE:   Deferred  Assessment / Plan: Early labor Category 2 FHR GBS positive. Will plan epidural placement, then AROM with internal monitor placement.  Nigel Bridgeman 05/02/2013, 11:30 PM

## 2013-05-02 NOTE — Telephone Encounter (Signed)
Preadmission screen  

## 2013-05-02 NOTE — MAU Note (Signed)
Contractions since 0300. Leaking clear fid since 1500. Contractions more intense for last 2 hours.

## 2013-05-02 NOTE — Anesthesia Procedure Notes (Signed)
Epidural Patient location during procedure: OB Start time: 05/02/2013 11:48 PM  Staffing Anesthesiologist: Brayton Caves Performed by: anesthesiologist   Preanesthetic Checklist Completed: patient identified, site marked, surgical consent, pre-op evaluation, timeout performed, IV checked, risks and benefits discussed and monitors and equipment checked  Epidural Patient position: sitting Prep: site prepped and draped and DuraPrep Patient monitoring: continuous pulse ox and blood pressure Approach: midline Injection technique: LOR air  Needle:  Needle type: Tuohy  Needle gauge: 17 G Needle length: 9 cm and 9 Needle insertion depth: 9 cm Catheter type: closed end flexible Catheter size: 19 Gauge Catheter at skin depth: 15 cm Test dose: negative  Assessment Events: blood not aspirated, injection not painful, no injection resistance, negative IV test and no paresthesia  Additional Notes Patient identified.  Risk benefits discussed including failed block, incomplete pain control, headache, nerve damage, paralysis, blood pressure changes, nausea, vomiting, reactions to medication both toxic or allergic, and postpartum back pain.  Patient expressed understanding and wished to proceed.  All questions were answered.  Sterile technique used throughout procedure and epidural site dressed with sterile barrier dressing. No paresthesia or other complications noted.The patient did not experience any signs of intravascular injection such as tinnitus or metallic taste in mouth nor signs of intrathecal spread such as rapid motor block. Please see nursing notes for vital signs.

## 2013-05-03 ENCOUNTER — Encounter (HOSPITAL_COMMUNITY): Payer: Self-pay | Admitting: *Deleted

## 2013-05-03 LAB — CBC
MCH: 28 pg (ref 26.0–34.0)
MCHC: 34.1 g/dL (ref 30.0–36.0)
Platelets: 248 10*3/uL (ref 150–400)
RBC: 4 MIL/uL (ref 3.87–5.11)
RDW: 13.4 % (ref 11.5–15.5)

## 2013-05-03 LAB — TYPE AND SCREEN: Antibody Screen: NEGATIVE

## 2013-05-03 LAB — RPR: RPR Ser Ql: NONREACTIVE

## 2013-05-03 LAB — ABO/RH: ABO/RH(D): A POS

## 2013-05-03 LAB — SURGICAL PCR SCREEN: Staphylococcus aureus: POSITIVE — AB

## 2013-05-03 MED ORDER — SIMETHICONE 80 MG PO CHEW: 80.0000 mg | CHEWABLE_TABLET | ORAL | Status: DC | PRN

## 2013-05-03 MED ORDER — ONDANSETRON HCL 4 MG/2ML IJ SOLN: 4.0000 mg | INTRAMUSCULAR | Status: DC | PRN

## 2013-05-03 MED ORDER — ZOLPIDEM TARTRATE 5 MG PO TABS: 5.0000 mg | ORAL_TABLET | Freq: Every evening | ORAL | Status: DC | PRN

## 2013-05-03 MED ORDER — DIPHENHYDRAMINE HCL 25 MG PO CAPS: 25.0000 mg | ORAL_CAPSULE | Freq: Four times a day (QID) | ORAL | Status: DC | PRN

## 2013-05-03 MED ORDER — LANOLIN HYDROUS EX OINT: TOPICAL_OINTMENT | CUTANEOUS | Status: DC | PRN

## 2013-05-03 MED ORDER — IBUPROFEN 600 MG PO TABS
600.0000 mg | ORAL_TABLET | Freq: Four times a day (QID) | ORAL | Status: DC
  Administered 2013-05-03 – 2013-05-05 (×9): 600 mg via ORAL
  Filled 2013-05-03 (×9): qty 1

## 2013-05-03 MED ORDER — TETANUS-DIPHTH-ACELL PERTUSSIS 5-2.5-18.5 LF-MCG/0.5 IM SUSP: 0.5000 mL | Freq: Once | INTRAMUSCULAR | Status: DC

## 2013-05-03 MED ORDER — BENZOCAINE-MENTHOL 20-0.5 % EX AERO
1.0000 "application " | INHALATION_SPRAY | CUTANEOUS | Status: DC | PRN
  Administered 2013-05-03 – 2013-05-04 (×2): 1 via TOPICAL
  Filled 2013-05-03 (×2): qty 56

## 2013-05-03 MED ORDER — ONDANSETRON HCL 4 MG PO TABS: 4.0000 mg | ORAL_TABLET | ORAL | Status: DC | PRN

## 2013-05-03 MED ORDER — PRENATAL MULTIVITAMIN CH
1.0000 | ORAL_TABLET | Freq: Every day | ORAL | Status: DC
  Administered 2013-05-03 – 2013-05-04 (×2): 1 via ORAL
  Filled 2013-05-03 (×2): qty 1

## 2013-05-03 MED ORDER — WITCH HAZEL-GLYCERIN EX PADS: 1.0000 "application " | MEDICATED_PAD | CUTANEOUS | Status: DC | PRN

## 2013-05-03 MED ORDER — OXYCODONE-ACETAMINOPHEN 5-325 MG PO TABS
1.0000 | ORAL_TABLET | ORAL | Status: DC | PRN
  Administered 2013-05-03 – 2013-05-04 (×3): 1 via ORAL
  Filled 2013-05-03: qty 1
  Filled 2013-05-03: qty 2
  Filled 2013-05-03 (×2): qty 1

## 2013-05-03 MED ORDER — SENNOSIDES-DOCUSATE SODIUM 8.6-50 MG PO TABS
2.0000 | ORAL_TABLET | ORAL | Status: DC
  Administered 2013-05-04 – 2013-05-05 (×2): 2 via ORAL
  Filled 2013-05-03 (×2): qty 2

## 2013-05-03 MED ORDER — DIBUCAINE 1 % RE OINT: 1.0000 "application " | TOPICAL_OINTMENT | RECTAL | Status: DC | PRN

## 2013-05-03 NOTE — Progress Notes (Signed)
  Subjective: Comfortable with epidural.  Objective: BP 109/67  Pulse 83  Temp(Src) 97.8 F (36.6 C) (Oral)  Resp 20  Ht 5\' 4"  (1.626 m)  Wt 250 lb 3.2 oz (113.49 kg)  BMI 42.93 kg/m2  SpO2 98%  LMP 07/19/2012      FHT:  Category 1 at present--baseline now 140-150, moderate variability. Still has sporadic moderate variables, but not repetitive. UC:   regular, every 6 minutes SVE:   Dilation: 3 Effacement (%): 100 Station: -1 Exam by:: VEmilee Hero, cnm AROM--clear fluid IUPC and FSE applied  Assessment / Plan: Early labor GBS positive FHR variables--resolved at present. Will observe adequacy of contractions s/p AROM--augment prn. Will continue to observe FHR status closely--amnioinfusion prn. Nigel Bridgeman 05/03/2013, 12:41 AM

## 2013-05-03 NOTE — Progress Notes (Signed)
plt up to bathroom with assistance.  Pt voided and instructed/demonstrated pericare

## 2013-05-03 NOTE — Anesthesia Postprocedure Evaluation (Signed)
  Anesthesia Post-op Note  Patient: Gina Burns  Procedure(s) Performed: * No procedures listed *  Patient Location: Mother/Baby  Anesthesia Type:Epidural  Level of Consciousness: awake  Airway and Oxygen Therapy: Patient Spontanous Breathing  Post-op Pain: mild  Post-op Assessment: Patient's Cardiovascular Status Stable and Respiratory Function Stable  Post-op Vital Signs: stable  Complications: No apparent anesthesia complications

## 2013-05-03 NOTE — Progress Notes (Addendum)
  Subjective: Feeling some pressure, but no pain.  Objective: BP 100/65  Pulse 83  Temp(Src) 97.8 F (36.6 C) (Oral)  Resp 18  Ht 5\' 4"  (1.626 m)  Wt 250 lb 3.2 oz (113.49 kg)  BMI 42.93 kg/m2  SpO2 98%  LMP 07/19/2012      FHT:  2 decels with contractions, 2 min in duration.  Resolved spontaneously.  FHR Category 1 prior to that, Category 2 at present with moderate ST variability. UC:   regular, every 3-4 minutes SVE:   Dilation: 3 Effacement (%): 100 Station: -1 Exam by:: VEmilee Burns, cnm MVUs 200 since 1:32am  Assessment / Plan: Early labor Sporadic decels Will continue to observe--no need for pitocin at present. Dr. Estanislado Pandy updated.  Gina Burns 05/03/2013, 2:13 AM

## 2013-05-04 MED ORDER — OXYCODONE-ACETAMINOPHEN 5-325 MG PO TABS: 1.0000 | ORAL_TABLET | ORAL | Status: DC | PRN

## 2013-05-04 MED ORDER — IBUPROFEN 600 MG PO TABS: 600.0000 mg | ORAL_TABLET | Freq: Four times a day (QID) | ORAL | Status: DC

## 2013-05-04 NOTE — Progress Notes (Signed)
Post Partum Day 1 Subjective:  Well. Lochia are normal. Voiding, ambulating, tolerating normal diet. nursing going well. Pediatrician recommending D/C in am when baby is 33 hours old because of + GBS  Objective: Blood pressure 105/66, pulse 65, temperature 98.1 F (36.7 C), temperature source Oral, resp. rate 18, height 5\' 4"  (1.626 m), weight 250 lb 3.2 oz (113.49 kg), last menstrual period 07/19/2012, SpO2 98.00%, unknown if currently breastfeeding.  Physical Exam:  General: normal Lochia: appropriate Uterine Fundus: 0/2 firm non-tender  Extremities: No evidence of DVT seen on physical exam. Edema 2+     Recent Labs  05/02/13 2230 05/03/13 0730  HGB 12.3 11.2*  HCT 35.8* 32.8*    Assessment/Plan: Normal Post-partum. Continue routine post-partum care. Anticipate discharge in am Patient desires Mirena. May request Depo_provera before d/c    LOS: 2 days   Rylah Fukuda A MD 05/04/2013, 11:37 AM

## 2013-05-05 ENCOUNTER — Inpatient Hospital Stay (HOSPITAL_COMMUNITY): Admission: RE | Admit: 2013-05-05 | Payer: Medicaid Other | Source: Ambulatory Visit

## 2013-05-05 MED ORDER — MEDROXYPROGESTERONE ACETATE 150 MG/ML IM SUSP
150.0000 mg | Freq: Once | INTRAMUSCULAR | Status: AC
  Administered 2013-05-05: 150 mg via INTRAMUSCULAR
  Filled 2013-05-05: qty 1

## 2013-05-05 NOTE — Discharge Summary (Signed)
   Obstetric Discharge Summary Reason for Admission: onset of labor Prenatal Procedures: none Intrapartum Procedures: spontaneous vaginal delivery Postpartum Procedures: none Complications-Operative and Postpartum: none  Temp:  [97.5 F (36.4 C)-98 F (36.7 C)] 98 F (36.7 C) (12/15 0543) Pulse Rate:  [71-87] 71 (12/15 0543) Resp:  [17-18] 18 (12/15 0543) BP: (111-119)/(61-65) 119/61 mmHg (12/15 0543) SpO2:  [99 %-100 %] 99 % (12/15 0543) Hemoglobin  Date Value Range Status  05/03/2013 11.2* 12.0 - 15.0 g/dL Final     HCT  Date Value Range Status  05/03/2013 32.8* 36.0 - 46.0 % Final    Hospital Course:  Hospital Course: Admitted in labor .. pos GBS. Delivery was performed by  without difficulty. Patient and baby tolerated the procedure without difficulty, with a periurthral laceration noted. Infant to FTN. Mother and infant then had an uncomplicated postpartum course, with breast feeding going well. Mom's physical exam was WNL, and she was discharged home in stable condition. Contraception plan was Mirena at 6 wks, Depo Provera x1 prior to D/C to day.  She received adequate benefit from po pain medications and declined prescription for Percocet  Discharge Diagnoses: Term Pregnancy-delivered  Discharge Information: Date: 05/05/2013 Activity:  pelvic rest Diet: routine Medications:    Medication List         ibuprofen 600 MG tablet  Commonly known as:  ADVIL,MOTRIN  Take 1 tablet (600 mg total) by mouth every 6 (six) hours.       Condition: stable Instructions: refer to practice specific booklet Discharge to: home     Follow-up Information   Follow up with [facility] In 5 weeks. (To prepare for IUD insertion at 6 wks)    Specialty:  Obstetrics and Gynecology   Contact information:   [address] [place] [address] [phone]      Newborn Data: Live born  Information for the patient's newborn:   Kushi, Kun [[id number]]  female ; APGAR8, 9, ; weight ; 6lb 11oz Home with mother.  Javone Ybanez P 05/05/2013, 10:46 AM

## 2013-05-05 NOTE — Progress Notes (Signed)
Ur chart review completed.  

## 2013-05-06 ENCOUNTER — Inpatient Hospital Stay (HOSPITAL_COMMUNITY): Admission: RE | Admit: 2013-05-06 | Payer: Medicaid Other | Source: Ambulatory Visit

## 2013-05-18 ENCOUNTER — Encounter (HOSPITAL_COMMUNITY): Payer: Self-pay | Admitting: *Deleted

## 2013-05-26 IMAGING — US US TRANSVAGINAL NON-OB
1 series · 14 of 25 positions shown · non-contrast
Comparison: 11/28/2010

CLINICAL DATA: Pelvic pain for 10 days.  Fibroids



[Series 1: us transvaginal non-ob · 0.28mm/px · 14 of 68 slices shown]
[im 1/68]
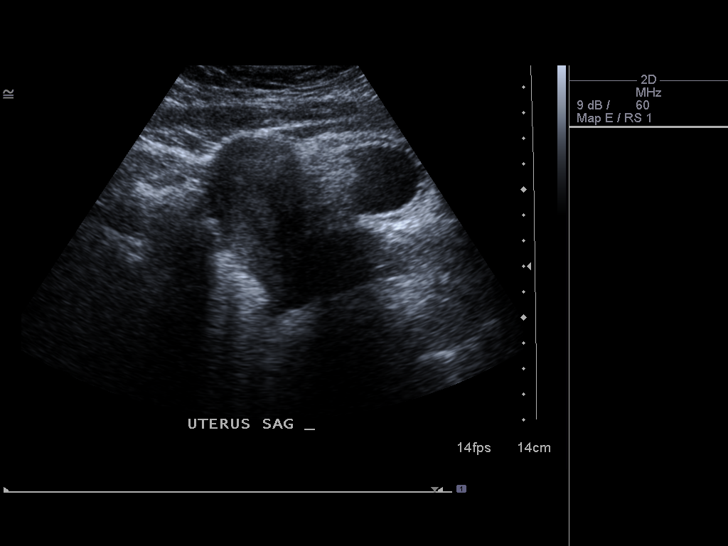
[im 6/68]
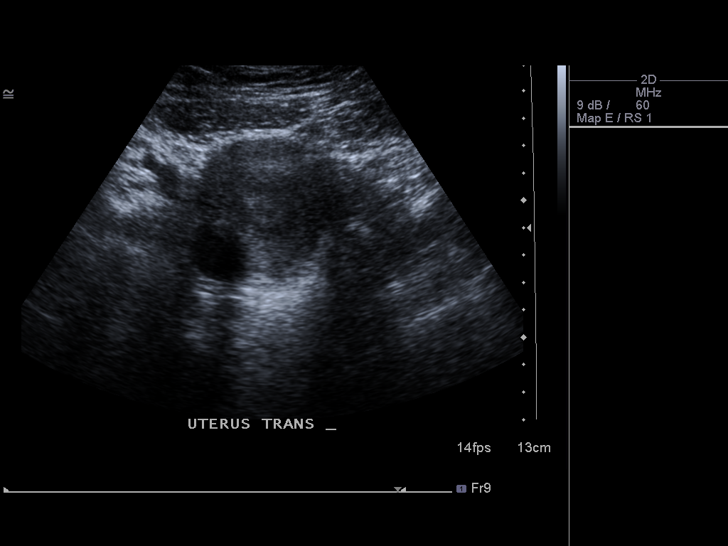
[im 12/68]
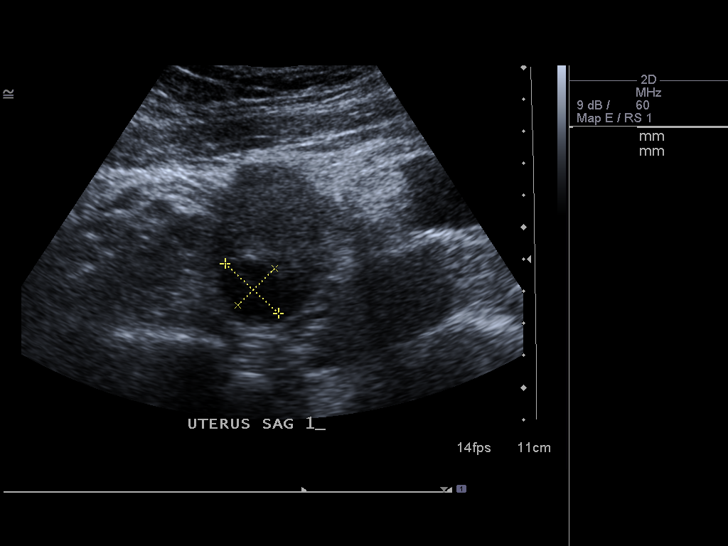
[im 17/68]
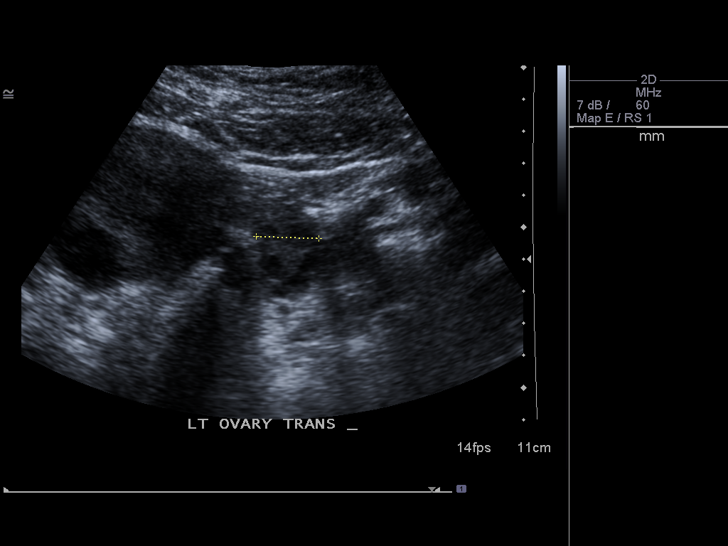
[im 23/68]
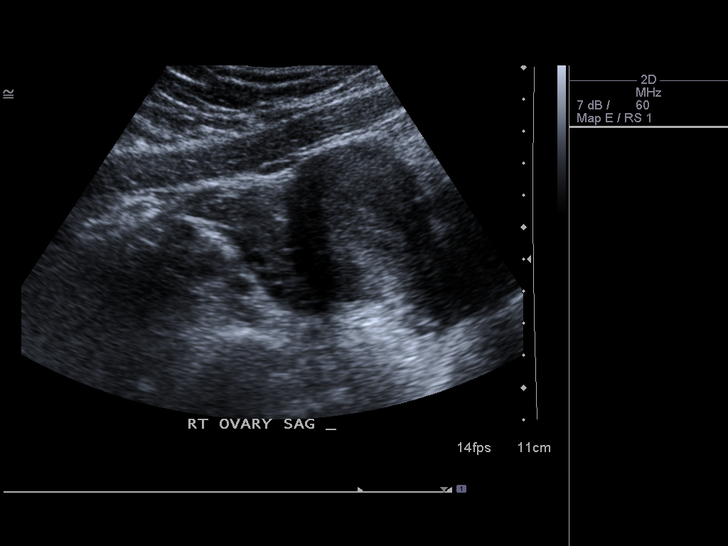
[im 26/68]
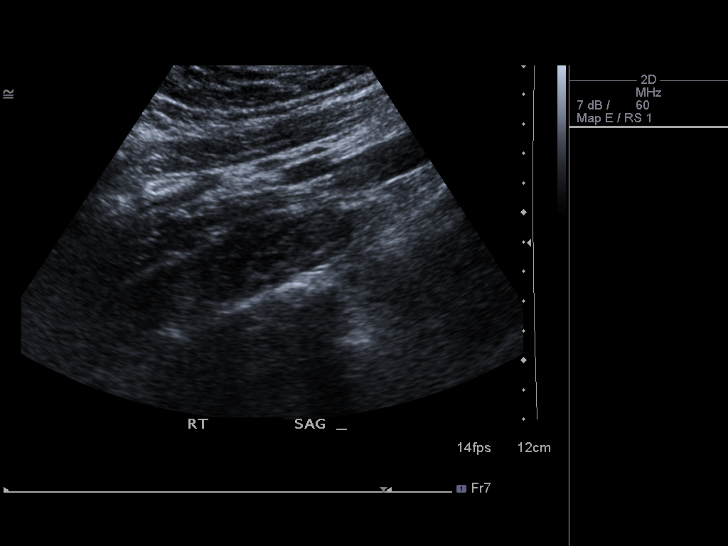
[im 31/68]
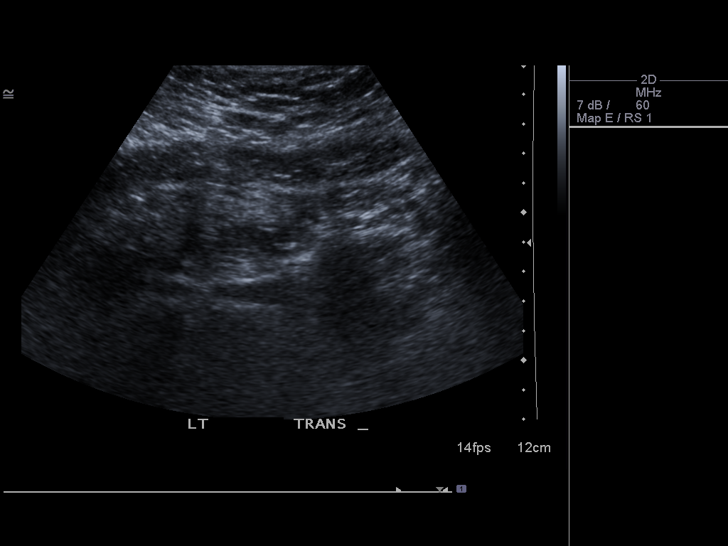
[im 37/68]
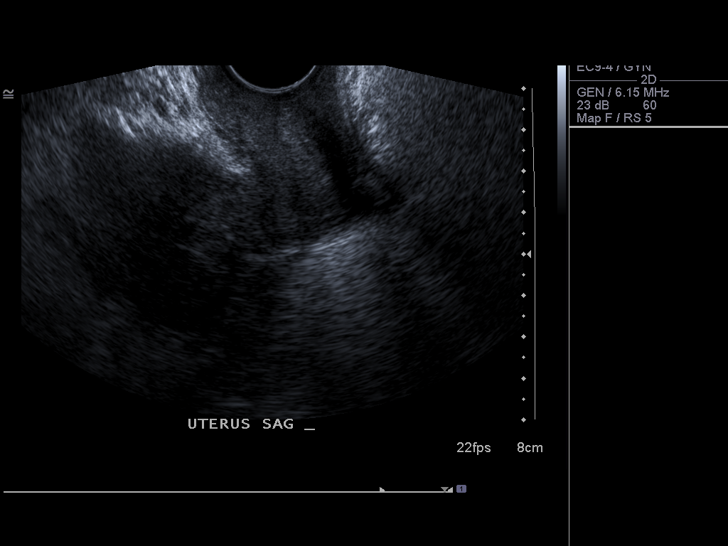
[im 42/68]
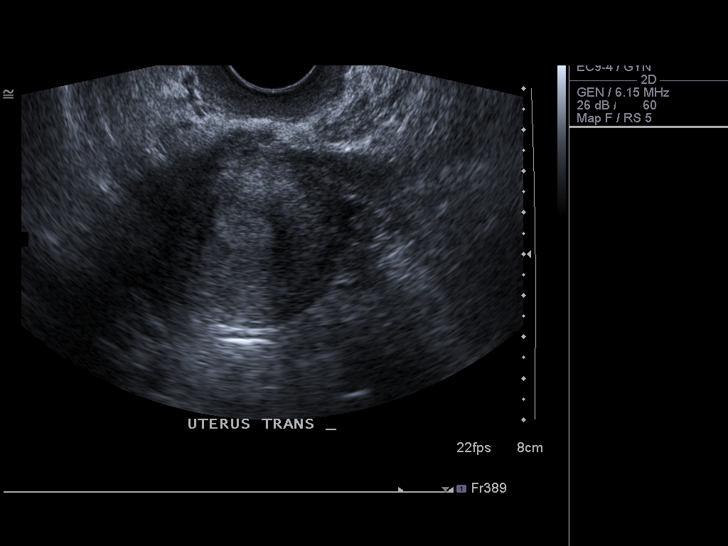
[im 45/68]
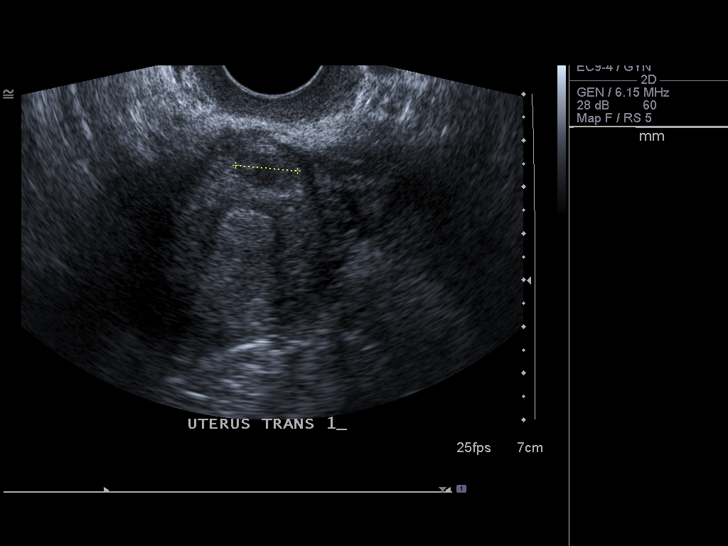
[im 51/68]
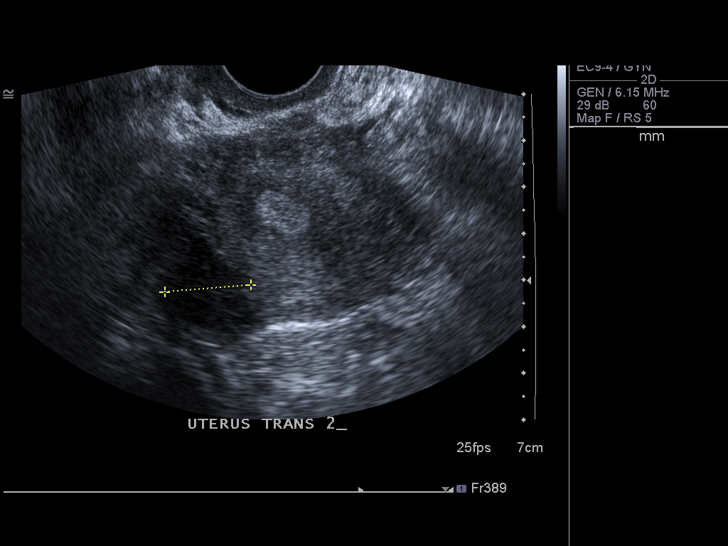
[im 56/68]
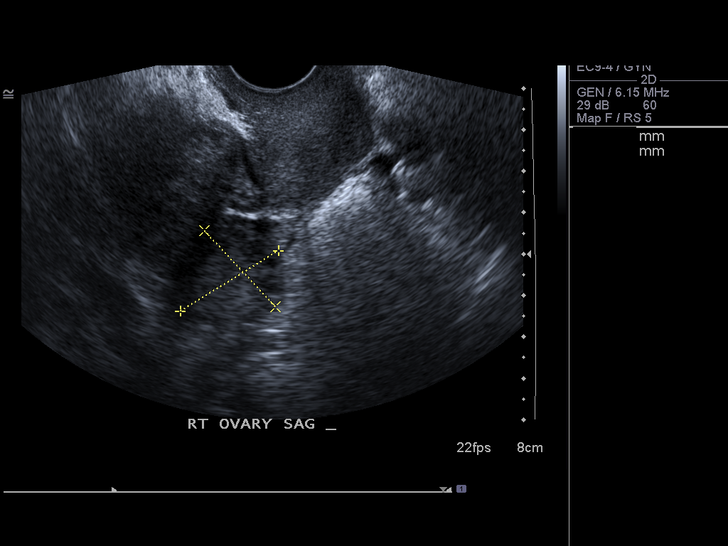
[im 62/68]
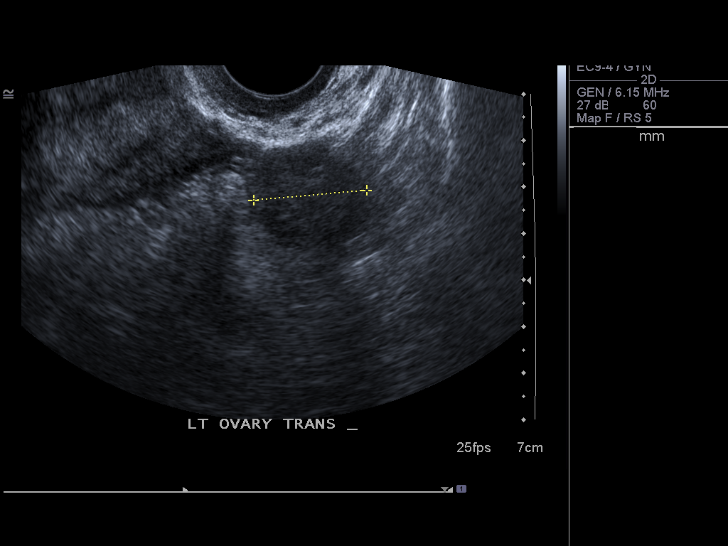
[im 68/68]
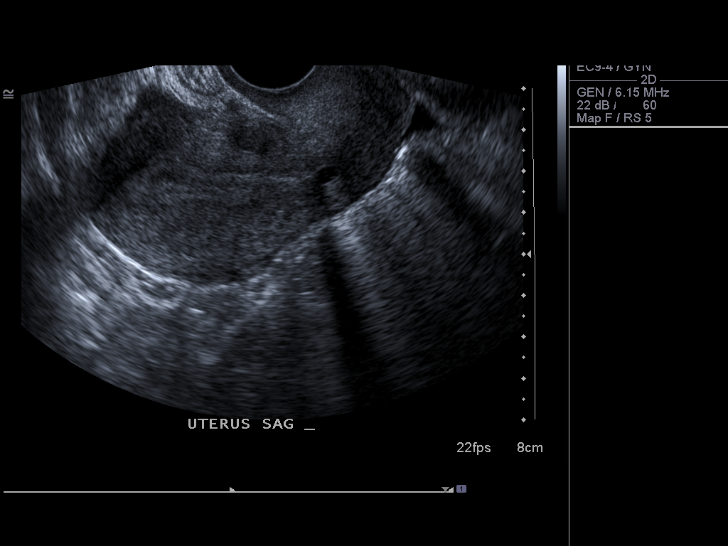

[14 of 25 positions shown; findings below may reference images not displayed]

FINDINGS: Uterus: Anteverted, anteflexed.  8.0 x 4.5 x 4.5 cm.  Anterior
fundal uterine body intramural fibroid measures 1.3 by 1.2 x
cm.  Posterior right lateral intramural fibroid measures 2.3 x
x 1.5 cm.  No subserosal component or mass effect upon the
endometrium.

Endometrium: 9 mm. No focal abnormality.  Borderline trilaminar in
appearance.

Right ovary:  2.8 x 2.5 x 2.2 cm.  Normal.

Left ovary: 2.6 x 2.4 x 2.1 cm.  Normal.

Other findings: Small free fluid noted.
IMPRESSION: Small intramural fibroids as above.  Otherwise normal exam..

## 2013-08-15 ENCOUNTER — Encounter (HOSPITAL_BASED_OUTPATIENT_CLINIC_OR_DEPARTMENT_OTHER): Payer: Self-pay | Admitting: Emergency Medicine

## 2013-08-15 ENCOUNTER — Emergency Department (HOSPITAL_BASED_OUTPATIENT_CLINIC_OR_DEPARTMENT_OTHER)
Admission: EM | Admit: 2013-08-15 | Discharge: 2013-08-15 | Disposition: A | Discharge status: Home / Self Care | Payer: Medicaid Other | Attending: Emergency Medicine | Admitting: Emergency Medicine

## 2013-08-15 DIAGNOSIS — Z87891 Personal history of nicotine dependence: Secondary | ICD-10-CM | POA: Insufficient documentation

## 2013-08-15 DIAGNOSIS — Z3202 Encounter for pregnancy test, result negative: Secondary | ICD-10-CM | POA: Insufficient documentation

## 2013-08-15 DIAGNOSIS — N390 Urinary tract infection, site not specified: Secondary | ICD-10-CM | POA: Insufficient documentation

## 2013-08-15 DIAGNOSIS — N760 Acute vaginitis: Secondary | ICD-10-CM | POA: Insufficient documentation

## 2013-08-15 DIAGNOSIS — B9689 Other specified bacterial agents as the cause of diseases classified elsewhere: Secondary | ICD-10-CM | POA: Insufficient documentation

## 2013-08-15 DIAGNOSIS — A499 Bacterial infection, unspecified: Secondary | ICD-10-CM | POA: Insufficient documentation

## 2013-08-15 LAB — PREGNANCY, URINE: PREG TEST UR: NEGATIVE

## 2013-08-15 LAB — URINALYSIS, ROUTINE W REFLEX MICROSCOPIC
GLUCOSE, UA: NEGATIVE mg/dL
Ketones, ur: NEGATIVE mg/dL
Nitrite: POSITIVE — AB
Protein, ur: NEGATIVE mg/dL
SPECIFIC GRAVITY, URINE: 1.028 (ref 1.005–1.030)
Urobilinogen, UA: 1 mg/dL (ref 0.0–1.0)
pH: 6.5 (ref 5.0–8.0)

## 2013-08-15 LAB — URINE MICROSCOPIC-ADD ON

## 2013-08-15 LAB — WET PREP, GENITAL
Trich, Wet Prep: NONE SEEN
Yeast Wet Prep HPF POC: NONE SEEN

## 2013-08-15 MED ORDER — METRONIDAZOLE 500 MG PO TABS: 500.0000 mg | ORAL_TABLET | Freq: Two times a day (BID) | ORAL | Status: DC

## 2013-08-15 MED ORDER — FLUCONAZOLE 50 MG PO TABS
150.0000 mg | ORAL_TABLET | Freq: Once | ORAL | Status: AC
  Administered 2013-08-15: 18:00:00 150 mg via ORAL
  Filled 2013-08-15 (×2): qty 1

## 2013-08-15 MED ORDER — CIPROFLOXACIN HCL 500 MG PO TABS: 500.0000 mg | ORAL_TABLET | Freq: Two times a day (BID) | ORAL | Status: DC

## 2013-08-15 MED ORDER — FLUCONAZOLE 200 MG PO TABS: ORAL_TABLET | ORAL | Status: DC

## 2013-08-15 NOTE — ED Notes (Signed)
States she has a yeast infection. New IUD earlier this month per pt.

## 2013-08-15 NOTE — ED Provider Notes (Signed)
Medical screening examination/treatment/procedure(s) were performed by non-physician practitioner and as supervising physician I was immediately available for consultation/collaboration.     Veryl Speak, MD 08/15/13 [phone]

## 2013-08-15 NOTE — Discharge Instructions (Signed)
Your lab today shows that you have a urinary tract infection, bacterial vaginosis and yeast. We are treating you for the infections. We have sent cultures and will only call you if you need additional treatment.

## 2013-08-15 NOTE — ED Provider Notes (Signed)
CSN: [id number]     Arrival date & time 08/15/13  1707 History   First MD Initiated Contact with Patient 08/15/13 1730     Chief Complaint  Patient presents with  . Vaginal Discharge     (Consider location/radiation/quality/duration/timing/severity/associated sxs/prior Treatment) Patient is a 23 y.o. female presenting with vaginal discharge. The history is provided by the patient.  Vaginal Discharge Quality:  White and thick Severity:  Moderate Onset quality:  Gradual Duration:  2 days Timing:  Constant Progression:  Worsening Chronicity:  New Relieved by:  None tried Worsened by:  Nothing tried Ineffective treatments:  None tried Associated symptoms: dysuria and vaginal itching   Associated symptoms: no abdominal pain, no dyspareunia, no fever, no genital lesions, no nausea, no urinary frequency, no urinary incontinence and no vomiting   Risk factors: no foreign body, no new sexual partner, no STI and no STI exposure    Gina Burns is a 23 y.o. female who presents to the ED with vaginal discharge. She states that she was taking antibiotics and the discharge started when she was finished. She has an IUD for birth control that she had inserted at her 6 week post partum visit in Jan 2015. She is having some spotting today. She  has been with her current sex partner x 6 years. She is having dysuria.   Past Medical History  Diagnosis Date  . Fibroids   . Hx of varicella   . ZTIWPYKD(983.3)    Past Surgical History  Procedure Laterality Date  . Induced abortion     No family history on file. History  Substance Use Topics  . Smoking status: Former Research scientist (life sciences)  . Smokeless tobacco: Never Used  . Alcohol Use: No   OB History   Grav Para Term Preterm Abortions TAB SAB Ect Mult Living   2 1 1  1 1    1      Review of Systems  Constitutional: Negative for fever.  HENT: Negative.   Respiratory: Negative for shortness of breath.   Cardiovascular: Negative for chest pain.   Gastrointestinal: Negative for nausea, vomiting and abdominal pain.  Genitourinary: Positive for dysuria and vaginal discharge. Negative for bladder incontinence, vaginal bleeding and dyspareunia. Vaginal pain: itching.  Musculoskeletal: Negative for back pain.  Skin: Negative for rash.  Neurological: Negative for light-headedness and headaches.  Psychiatric/Behavioral: Negative for confusion. The patient is not nervous/anxious.       Allergies  Review of patient's allergies indicates no known allergies.  Home Medications   Current Outpatient Rx  Name  Route  Sig  Dispense  Refill  . ibuprofen (ADVIL,MOTRIN) 600 MG tablet   Oral   Take 1 tablet (600 mg total) by mouth every 6 (six) hours.   30 tablet   0    BP 135/98  Pulse 98  Temp(Src) 99 F (37.2 C) (Oral)  Resp 20  Ht 5\' 4"  (1.626 m)  Wt 222 lb (100.699 kg)  BMI 38.09 kg/m2  SpO2 100% Physical Exam  Nursing note and vitals reviewed. Constitutional: She is oriented to person, place, and time. She appears well-developed and well-nourished.  HENT:  Head: Normocephalic and atraumatic.  Eyes: Conjunctivae and EOM are normal.  Neck: Neck supple.  Cardiovascular: Normal rate and regular rhythm.   Pulmonary/Chest: Effort normal and breath sounds normal.  Abdominal: Soft. There is no tenderness.  Genitourinary:  External genitalia without lesions. Scant blood with Thick white discharge vaginal vault. No CMT, IUD string visible, no adnexal mass or  tenderness palpated, uterus without palpable enlargement.   Musculoskeletal: Normal range of motion.  Neurological: She is alert and oriented to person, place, and time. No cranial nerve deficit.  Skin: Skin is warm and dry.  Psychiatric: She has a normal mood and affect. Her behavior is normal.     Results for orders placed during the hospital encounter of 08/15/13 (from the past 24 hour(s))  URINALYSIS, ROUTINE W REFLEX MICROSCOPIC     Status: Abnormal   Collection Time     08/15/13  5:15 PM      Result Value Ref Range   Color, Urine AMBER (*) YELLOW   APPearance CLOUDY (*) CLEAR   Specific Gravity, Urine 1.028  1.005 - 1.030   pH 6.5  5.0 - 8.0   Glucose, UA NEGATIVE  NEGATIVE mg/dL   Hgb urine dipstick LARGE (*) NEGATIVE   Bilirubin Urine SMALL (*) NEGATIVE   Ketones, ur NEGATIVE  NEGATIVE mg/dL   Protein, ur NEGATIVE  NEGATIVE mg/dL   Urobilinogen, UA 1.0  0.0 - 1.0 mg/dL   Nitrite POSITIVE (*) NEGATIVE   Leukocytes, UA LARGE (*) NEGATIVE  PREGNANCY, URINE     Status: None   Collection Time    08/15/13  5:15 PM      Result Value Ref Range   Preg Test, Ur NEGATIVE  NEGATIVE  URINE MICROSCOPIC-ADD ON     Status: Abnormal   Collection Time    08/15/13  5:15 PM      Result Value Ref Range   Squamous Epithelial / LPF RARE  RARE   WBC, UA 21-50  <3 WBC/hpf   RBC / HPF 7-10  <3 RBC/hpf   Bacteria, UA MANY (*) RARE   Urine-Other MUCOUS PRESENT    WET PREP, GENITAL     Status: Abnormal   Collection Time    08/15/13  5:58 PM      Result Value Ref Range   Yeast Wet Prep HPF POC NONE SEEN  NONE SEEN   Trich, Wet Prep NONE SEEN  NONE SEEN   Clue Cells Wet Prep HPF POC MODERATE (*) NONE SEEN   WBC, Wet Prep HPF POC MANY (*) NONE SEEN    ED Course  Procedures MDM  23 y.o. female with vaginal discharge and dysuria. Will treat UTI and BV with antibiotics. Will give the patient Diflucan since clinical findings consistent with monilia vaginosis. I have reviewed this patient's vital signs, nurses notes, appropriate labs and discussed findings and plan of care with the patient. She voices understanding.  She is stable for discharge without signs of pyelo or PID.     Medication List    TAKE these medications       ciprofloxacin 500 MG tablet  Commonly known as:  CIPRO  Take 1 tablet (500 mg total) by mouth every 12 (twelve) hours.     fluconazole 200 MG tablet  Commonly known as:  DIFLUCAN  Take this tablet in 3 days.     metroNIDAZOLE 500 MG  tablet  Commonly known as:  FLAGYL  Take 1 tablet (500 mg total) by mouth 2 (two) times daily.      ASK your doctor about these medications       ibuprofen 600 MG tablet  Commonly known as:  ADVIL,MOTRIN  Take 1 tablet (600 mg total) by mouth every 6 (six) hours.           [place], [place] 08/15/13 [phone]

## 2013-08-16 LAB — GC/CHLAMYDIA PROBE AMP
CT Probe RNA: NEGATIVE
GC Probe RNA: NEGATIVE

## 2014-03-23 ENCOUNTER — Encounter (HOSPITAL_BASED_OUTPATIENT_CLINIC_OR_DEPARTMENT_OTHER): Payer: Self-pay | Admitting: Emergency Medicine

## 2014-06-16 ENCOUNTER — Emergency Department (HOSPITAL_BASED_OUTPATIENT_CLINIC_OR_DEPARTMENT_OTHER)
Admission: EM | Admit: 2014-06-16 | Discharge: 2014-06-16 | Disposition: A | Discharge status: Home / Self Care | Payer: Medicaid Other | Attending: Emergency Medicine | Admitting: Emergency Medicine

## 2014-06-16 ENCOUNTER — Encounter (HOSPITAL_BASED_OUTPATIENT_CLINIC_OR_DEPARTMENT_OTHER): Payer: Self-pay | Admitting: *Deleted

## 2014-06-16 DIAGNOSIS — Z87891 Personal history of nicotine dependence: Secondary | ICD-10-CM | POA: Insufficient documentation

## 2014-06-16 DIAGNOSIS — Z8619 Personal history of other infectious and parasitic diseases: Secondary | ICD-10-CM | POA: Insufficient documentation

## 2014-06-16 DIAGNOSIS — Z202 Contact with and (suspected) exposure to infections with a predominantly sexual mode of transmission: Secondary | ICD-10-CM

## 2014-06-16 DIAGNOSIS — Z791 Long term (current) use of non-steroidal anti-inflammatories (NSAID): Secondary | ICD-10-CM | POA: Insufficient documentation

## 2014-06-16 DIAGNOSIS — Z79899 Other long term (current) drug therapy: Secondary | ICD-10-CM | POA: Insufficient documentation

## 2014-06-16 DIAGNOSIS — Z3202 Encounter for pregnancy test, result negative: Secondary | ICD-10-CM | POA: Insufficient documentation

## 2014-06-16 DIAGNOSIS — N898 Other specified noninflammatory disorders of vagina: Secondary | ICD-10-CM | POA: Insufficient documentation

## 2014-06-16 DIAGNOSIS — N390 Urinary tract infection, site not specified: Secondary | ICD-10-CM

## 2014-06-16 LAB — WET PREP, GENITAL
TRICH WET PREP: NONE SEEN
Yeast Wet Prep HPF POC: NONE SEEN

## 2014-06-16 LAB — URINALYSIS, ROUTINE W REFLEX MICROSCOPIC
BILIRUBIN URINE: NEGATIVE
Glucose, UA: NEGATIVE mg/dL
Hgb urine dipstick: NEGATIVE
KETONES UR: NEGATIVE mg/dL
NITRITE: POSITIVE — AB
PH: 6.5 (ref 5.0–8.0)
PROTEIN: NEGATIVE mg/dL
Specific Gravity, Urine: 1.025 (ref 1.005–1.030)
Urobilinogen, UA: 1 mg/dL (ref 0.0–1.0)

## 2014-06-16 LAB — URINE MICROSCOPIC-ADD ON

## 2014-06-16 LAB — PREGNANCY, URINE: PREG TEST UR: NEGATIVE

## 2014-06-16 MED ORDER — CIPROFLOXACIN HCL 500 MG PO TABS: 500.0000 mg | ORAL_TABLET | Freq: Two times a day (BID) | ORAL | Status: DC

## 2014-06-16 MED ORDER — LIDOCAINE HCL (PF) 1 % IJ SOLN
INTRAMUSCULAR | Status: AC
  Administered 2014-06-16: 5 mL via INTRAMUSCULAR
  Filled 2014-06-16: qty 5

## 2014-06-16 MED ORDER — CIPROFLOXACIN HCL 500 MG PO TABS
500.0000 mg | ORAL_TABLET | Freq: Once | ORAL | Status: AC
  Administered 2014-06-16: 500 mg via ORAL
  Filled 2014-06-16: qty 1

## 2014-06-16 MED ORDER — CEFTRIAXONE SODIUM 250 MG IJ SOLR
250.0000 mg | Freq: Once | INTRAMUSCULAR | Status: AC
  Administered 2014-06-16: 250 mg via INTRAMUSCULAR
  Filled 2014-06-16: qty 250

## 2014-06-16 MED ORDER — AZITHROMYCIN 250 MG PO TABS
1000.0000 mg | ORAL_TABLET | Freq: Once | ORAL | Status: AC
  Administered 2014-06-16: 1000 mg via ORAL
  Filled 2014-06-16: qty 4

## 2014-06-16 NOTE — Discharge Instructions (Signed)
Labs consistent with urinary tract infection. Take the antibiotic Cipro as directed for the next 7 days. First dose provided here today next dose will be tomorrow. For concerns for possible STD exposure of treatment provided here. As we discussed prep wet prep showed no evidence of significant clue cells representing bacterial vaginosis and therefore no significant bacterial vaginosis infection. Also no evidence of yeast or trichomonas.  Expected to be feeling better in a couple days if not return for follow-up.

## 2014-06-16 NOTE — ED Notes (Signed)
Pt c/o lower abd pain with vaginal discharge x 2 days

## 2014-06-16 NOTE — ED Provider Notes (Signed)
CSN: [id number]     Arrival date & time 06/16/14  1648 History   First MD Initiated Contact with Patient 06/16/14 1652     Chief Complaint  Patient presents with  . Vaginal Discharge     (Consider location/radiation/quality/duration/timing/severity/associated sxs/prior Treatment) Patient is a 24 y.o. female presenting with vaginal discharge. The history is provided by the patient.  Vaginal Discharge Associated symptoms: abdominal pain and dysuria   Associated symptoms: no fever, no nausea and no vomiting    patient presents with 2 day history of vaginal discharge. Patient states that she gets a bacterial vaginal infection every month. Is usually treated with Flagyl. Suggestive of bacterial vaginosis. Patient also with some dysuria and burning with urination and some lower abdominal pain. Patient also concerned about STD exposure. Patient states she has an IUD in place. Vaginal discharge is milky in nature nonbloody. No fevers no nausea no vomiting.  Past Medical History  Diagnosis Date  . Fibroids   . Hx of varicella   . CVUDTHYH(888.7)    Past Surgical History  Procedure Laterality Date  . Induced abortion     History reviewed. No pertinent family history. History  Substance Use Topics  . Smoking status: Former Research scientist (life sciences)  . Smokeless tobacco: Never Used  . Alcohol Use: No   OB History    Gravida Para Term Preterm AB TAB SAB Ectopic Multiple Living   2 1 1  1 1    1      Review of Systems  Constitutional: Negative for fever.  HENT: Negative for congestion.   Eyes: Negative for visual disturbance.  Respiratory: Negative for shortness of breath.   Cardiovascular: Negative for chest pain.  Gastrointestinal: Positive for abdominal pain. Negative for nausea and vomiting.  Genitourinary: Positive for dysuria and vaginal discharge. Negative for vaginal bleeding.  Musculoskeletal: Negative for back pain.  Skin: Negative for rash.  Neurological: Negative for headaches.   Hematological: Does not bruise/bleed easily.  Psychiatric/Behavioral: Negative for confusion.      Allergies  Review of patient's allergies indicates no known allergies.  Home Medications   Prior to Admission medications   Medication Sig Start Date End Date Taking? Authorizing Provider  fluconazole (DIFLUCAN) 200 MG tablet Take this tablet in 3 days. 08/15/13   Hope Bunnie Pion, NP  ibuprofen (ADVIL,MOTRIN) 600 MG tablet Take 1 tablet (600 mg total) by mouth every 6 (six) hours. 05/04/13   Delsa Bern, MD   BP 118/82 mmHg  Pulse 78  Temp(Src) 98.6 F (37 C)  Resp 18  Ht 5\' 4"  (1.626 m)  Wt 209 lb (94.802 kg)  BMI 35.86 kg/m2  SpO2 100% Physical Exam  Constitutional: She is oriented to person, place, and time. She appears well-developed and well-nourished. No distress.  HENT:  Head: Normocephalic and atraumatic.  Mouth/Throat: Oropharynx is clear and moist.  Eyes: Conjunctivae and EOM are normal. Pupils are equal, round, and reactive to light.  Neck: Normal range of motion.  Cardiovascular: Normal rate, regular rhythm and normal heart sounds.   Pulmonary/Chest: Effort normal and breath sounds normal. No respiratory distress.  Abdominal: Soft. Bowel sounds are normal. There is no tenderness.  Genitourinary: Uterus normal. Vaginal discharge found.  Patient with white vaginal discharge no bleeding. The IUD string noted. No cervical motion tenderness no uterine tenderness. No adnexal tenderness. Normal external genitalia. No lesions.  Musculoskeletal: Normal range of motion.  Neurological: She is alert and oriented to person, place, and time. No cranial nerve deficit. She exhibits normal  muscle tone. Coordination normal.  Skin: Skin is warm. No rash noted.  Nursing note and vitals reviewed.   ED Course  Procedures (including critical care time) Labs Review Labs Reviewed  WET PREP, GENITAL  URINALYSIS, ROUTINE W REFLEX MICROSCOPIC  PREGNANCY, URINE  GC/CHLAMYDIA PROBE AMP  ([place])   Results for orders placed or performed during the hospital encounter of 06/16/14  Wet prep, genital  Result Value Ref Range   Yeast Wet Prep HPF POC NONE SEEN NONE SEEN   Trich, Wet Prep NONE SEEN NONE SEEN   Clue Cells Wet Prep HPF POC FEW (A) NONE SEEN   WBC, Wet Prep HPF POC FEW (A) NONE SEEN  Urinalysis, Routine w reflex microscopic  Result Value Ref Range   Color, Urine YELLOW YELLOW   APPearance CLOUDY (A) CLEAR   Specific Gravity, Urine 1.025 1.005 - 1.030   pH 6.5 5.0 - 8.0   Glucose, UA NEGATIVE NEGATIVE mg/dL   Hgb urine dipstick NEGATIVE NEGATIVE   Bilirubin Urine NEGATIVE NEGATIVE   Ketones, ur NEGATIVE NEGATIVE mg/dL   Protein, ur NEGATIVE NEGATIVE mg/dL   Urobilinogen, UA 1.0 0.0 - 1.0 mg/dL   Nitrite POSITIVE (A) NEGATIVE   Leukocytes, UA SMALL (A) NEGATIVE  Pregnancy, urine  Result Value Ref Range   Preg Test, Ur NEGATIVE NEGATIVE  Urine microscopic-add on  Result Value Ref Range   Squamous Epithelial / LPF FEW (A) RARE   WBC, UA 7-10 <3 WBC/hpf   Bacteria, UA MANY (A) RARE     Imaging Review No results found.   EKG Interpretation None      MDM   Final diagnoses:  Vaginal discharge  Possible exposure to STD    Pregnancy test negative. Urinalysis consistent with urinary tract infection with positive nitrite. Urine culture sent. Patient with concern for STDs treated empirically with Rocephin and Zithromax. Wet prep without evidence of clue cells to represent bacterial vaginosis. No evidence Trichomonas no evidence of yeast infection.  Fredia Sorrow, MD 06/16/14 1756

## 2014-06-16 NOTE — ED Notes (Signed)
MD at bedside. 

## 2014-06-19 LAB — URINE CULTURE

## 2014-06-21 ENCOUNTER — Telehealth (HOSPITAL_COMMUNITY): Payer: Self-pay

## 2014-06-21 NOTE — Telephone Encounter (Signed)
Post ED Visit - Positive Culture Follow-up  Culture report reviewed by antimicrobial stewardship pharmacist: []  Wes Ocean, Pharm.D., BCPS []  Heide Guile, Pharm.D., BCPS [x]  Alycia Rossetti, Pharm.D., BCPS []  [place], Pharm.D., BCPS, AAHIVP []  Legrand Como, Pharm.D., BCPS, AAHIVP []  Isac Sarna, Pharm.D., BCPS  Positive Urine culture, >/= 100,000 colonies -> E Coli Treated with Ciprofloxacin, organism sensitive to the same and no further patient follow-up is required at this time.  Dortha Kern 06/21/2014, 11:11 PM

## 2014-11-12 ENCOUNTER — Emergency Department (HOSPITAL_BASED_OUTPATIENT_CLINIC_OR_DEPARTMENT_OTHER)
Admission: EM | Admit: 2014-11-12 | Discharge: 2014-11-12 | Disposition: A | Discharge status: Home / Self Care | Payer: Medicaid Other | Attending: Emergency Medicine | Admitting: Emergency Medicine

## 2014-11-12 ENCOUNTER — Encounter (HOSPITAL_BASED_OUTPATIENT_CLINIC_OR_DEPARTMENT_OTHER): Payer: Self-pay | Admitting: *Deleted

## 2014-11-12 DIAGNOSIS — Z3202 Encounter for pregnancy test, result negative: Secondary | ICD-10-CM | POA: Insufficient documentation

## 2014-11-12 DIAGNOSIS — Z8619 Personal history of other infectious and parasitic diseases: Secondary | ICD-10-CM | POA: Insufficient documentation

## 2014-11-12 DIAGNOSIS — B9689 Other specified bacterial agents as the cause of diseases classified elsewhere: Secondary | ICD-10-CM

## 2014-11-12 DIAGNOSIS — Z86018 Personal history of other benign neoplasm: Secondary | ICD-10-CM | POA: Insufficient documentation

## 2014-11-12 DIAGNOSIS — N760 Acute vaginitis: Secondary | ICD-10-CM | POA: Insufficient documentation

## 2014-11-12 DIAGNOSIS — Z87891 Personal history of nicotine dependence: Secondary | ICD-10-CM | POA: Insufficient documentation

## 2014-11-12 LAB — WET PREP, GENITAL
Trich, Wet Prep: NONE SEEN
YEAST WET PREP: NONE SEEN

## 2014-11-12 LAB — URINALYSIS, ROUTINE W REFLEX MICROSCOPIC
Bilirubin Urine: NEGATIVE
GLUCOSE, UA: NEGATIVE mg/dL
Hgb urine dipstick: NEGATIVE
KETONES UR: NEGATIVE mg/dL
Nitrite: NEGATIVE
PROTEIN: NEGATIVE mg/dL
Specific Gravity, Urine: 1.03 (ref 1.005–1.030)
Urobilinogen, UA: 1 mg/dL (ref 0.0–1.0)
pH: 6.5 (ref 5.0–8.0)

## 2014-11-12 LAB — PREGNANCY, URINE: PREG TEST UR: NEGATIVE

## 2014-11-12 LAB — URINE MICROSCOPIC-ADD ON

## 2014-11-12 MED ORDER — METRONIDAZOLE 500 MG PO TABS: 500.0000 mg | ORAL_TABLET | Freq: Two times a day (BID) | ORAL | Status: DC

## 2014-11-12 NOTE — ED Provider Notes (Signed)
CSN: [id number]     Arrival date & time 11/12/14  8299 History   First MD Initiated Contact with Patient 11/12/14 0845     Chief Complaint  Patient presents with  . Pelvic Pain     (Consider location/radiation/quality/duration/timing/severity/associated sxs/prior Treatment) Patient is a 24 y.o. female presenting with pelvic pain. The history is provided by the patient.  Pelvic Pain This is a new problem. The current episode started in the past 7 days. The problem occurs intermittently. The problem has been gradually worsening. Associated symptoms include abdominal pain. Pertinent negatives include no chest pain, chills, fever, nausea, numbness, rash, vomiting or weakness.    Gina Burns is a 24 yo F PMH fibroids that is presenting with lower abdominal pain. The pain started about 4 days ago. Donald pain is sharp, an 8/10 and last for roughly 3-5 seconds. She has an IUD in place for the past 16 months. She does have some new vaginal odor but no vaginal discharge. She had a normal bowel movement yesterday that was nonbloody. She did have unprotected sex about 3 weeks ago. She has no history of gonorrhea or chlamydia infection. She denies any nausea, vomiting, constipation, diarrhea, fever, chills, night sweats, or dysuria.  Past Medical History  Diagnosis Date  . Fibroids   . Hx of varicella   . BZJIRCVE(938.1)    Past Surgical History  Procedure Laterality Date  . Induced abortion     History reviewed. No pertinent family history. History  Substance Use Topics  . Smoking status: Former Research scientist (life sciences)  . Smokeless tobacco: Never Used  . Alcohol Use: No   OB History    Gravida Para Term Preterm AB TAB SAB Ectopic Multiple Living   2 1 1  1 1    1      Review of Systems  Constitutional: Negative for fever and chills.  Respiratory: Negative for shortness of breath.   Cardiovascular: Negative for chest pain.  Gastrointestinal: Positive for abdominal pain. Negative for nausea, vomiting,  diarrhea, constipation and blood in stool.  Genitourinary: Positive for pelvic pain. Negative for dysuria.  Skin: Negative for rash.  Neurological: Negative for weakness and numbness.      Allergies  Review of patient's allergies indicates no known allergies.  Home Medications   Prior to Admission medications   Medication Sig Start Date End Date Taking? Authorizing Provider  metroNIDAZOLE (FLAGYL) 500 MG tablet Take 1 tablet (500 mg total) by mouth 2 (two) times daily. 11/12/14   Rosemarie Ax, MD   BP 125/99 mmHg  Pulse 86  Temp(Src) 98.2 F (36.8 C) (Oral)  Resp 18  Ht 5\' 3"  (1.6 m)  Wt 202 lb (91.627 kg)  BMI 35.79 kg/m2  SpO2 100% Physical Exam  Constitutional: She is oriented to person, place, and time. She appears well-developed and well-nourished.  HENT:  Head: Normocephalic and atraumatic.  Eyes: Conjunctivae and EOM are normal.  Neck: Normal range of motion.  Cardiovascular: Normal rate, regular rhythm, normal heart sounds and intact distal pulses.   No murmur heard. Pulmonary/Chest: Effort normal and breath sounds normal. She has no wheezes. She has no rales.  Abdominal: Soft. Bowel sounds are normal. She exhibits no distension. There is tenderness in the left lower quadrant. There is no rebound and no guarding.    Genitourinary: Rectum normal, vagina normal and uterus normal. Cervix exhibits no motion tenderness. Right adnexum displays no mass. Left adnexum displays no mass.  IUD strings observed  Musculoskeletal: Normal range of motion.  Neurological: She is alert and oriented to person, place, and time.  Skin: Skin is warm. No rash noted.    ED Course  Procedures (including critical care time) Labs Review Labs Reviewed  WET PREP, GENITAL - Abnormal; Notable for the following:    Clue Cells Wet Prep HPF POC MODERATE (*)    WBC, Wet Prep HPF POC FEW (*)    All other components within normal limits  URINALYSIS, ROUTINE W REFLEX MICROSCOPIC (NOT AT  [facility]) - Abnormal; Notable for the following:    APPearance CLOUDY (*)    Leukocytes, UA TRACE (*)    All other components within normal limits  URINE MICROSCOPIC-ADD ON - Abnormal; Notable for the following:    Squamous Epithelial / LPF FEW (*)    Bacteria, UA MANY (*)    All other components within normal limits  PREGNANCY, URINE  GC/CHLAMYDIA PROBE AMP ([place]) NOT AT [facility]    Imaging Review No results found.   EKG Interpretation None      MDM   Final diagnoses:  BV (bacterial vaginosis)   [name] is a 24 yo F p/w with LLQ pain. UA not suggestive of infection and wet prep revealing BV. Upreg neg and IUD strings observed on exam. Given Flagyl 500 mg BID for 7 days.  Ibuprofen for pain.   Rosemarie Ax, MD PGY-2, [facility] 11/12/2014, 10:16 AM     Rosemarie Ax, MD 11/12/14 [place], MD 11/12/14 1235

## 2014-11-12 NOTE — ED Notes (Signed)
Pelvic cart at bedside. 

## 2014-11-12 NOTE — ED Notes (Signed)
MD at bedside. 

## 2014-11-12 NOTE — ED Notes (Signed)
Pt amb to room 5 with quick steady gait in nad. Pt reports "sharp" pelvic pain x 3 days, and wonders if her iud is in it's proper place. Pt denies discharge, but reports foul odor. Denies any fruequency, urgency or dysuria, urine sample is dark tea colored and cloudy.

## 2014-11-12 NOTE — ED Notes (Signed)
RN to bedside for triage.

## 2014-11-12 NOTE — Discharge Instructions (Signed)
Bacterial Vaginosis Bacterial vaginosis is a vaginal infection that occurs when the normal balance of bacteria in the vagina is disrupted. It results from an overgrowth of certain bacteria. This is the most common vaginal infection in women of childbearing age. Treatment is important to prevent complications, especially in pregnant women, as it can cause a premature delivery. CAUSES  Bacterial vaginosis is caused by an increase in harmful bacteria that are normally present in smaller amounts in the vagina. Several different kinds of bacteria can cause bacterial vaginosis. However, the reason that the condition develops is not fully understood. RISK FACTORS Certain activities or behaviors can put you at an increased risk of developing bacterial vaginosis, including:  Having a new sex partner or multiple sex partners.  Douching.  Using an intrauterine device (IUD) for contraception. Women do not get bacterial vaginosis from toilet seats, bedding, swimming pools, or contact with objects around them. SIGNS AND SYMPTOMS  Some women with bacterial vaginosis have no signs or symptoms. Common symptoms include:  Grey vaginal discharge.  A fishlike odor with discharge, especially after sexual intercourse.  Itching or burning of the vagina and vulva.  Burning or pain with urination. DIAGNOSIS  Your health care provider will take a medical history and examine the vagina for signs of bacterial vaginosis. A sample of vaginal fluid may be taken. Your health care provider will look at this sample under a microscope to check for bacteria and abnormal cells. A vaginal pH test may also be done.  TREATMENT  Bacterial vaginosis may be treated with antibiotic medicines. These may be given in the form of a pill or a vaginal cream. A second round of antibiotics may be prescribed if the condition comes back after treatment.  HOME CARE INSTRUCTIONS   Only take over-the-counter or prescription medicines as  directed by your health care provider.  If antibiotic medicine was prescribed, take it as directed. Make sure you finish it even if you start to feel better.  Do not have sex until treatment is completed.  Tell all sexual partners that you have a vaginal infection. They should see their health care provider and be treated if they have problems, such as a mild rash or itching.  Practice safe sex by using condoms and only having one sex partner. SEEK MEDICAL CARE IF:   Your symptoms are not improving after 3 days of treatment.  You have increased discharge or pain.  You have a fever. MAKE SURE YOU:   Understand these instructions.  Will watch your condition.  Will get help right away if you are not doing well or get worse. FOR MORE INFORMATION  Centers for Disease Control and Prevention, Division of STD Prevention: www.cdc.gov/std American Sexual Health Association (ASHA): www.ashastd.org  Document Released: 05/08/2005 Document Revised: 02/26/2013 Document Reviewed: 12/18/2012 ExitCare Patient Information 2015 [employer]. This information is not intended to replace advice given to you by your health care provider. Make sure you discuss any questions you have with your health care provider.  

## 2014-11-13 LAB — GC/CHLAMYDIA PROBE AMP (~~LOC~~) NOT AT ARMC
CHLAMYDIA, DNA PROBE: POSITIVE — AB
NEISSERIA GONORRHEA: NEGATIVE

## 2014-11-16 ENCOUNTER — Telehealth (HOSPITAL_COMMUNITY): Payer: Self-pay

## 2014-11-16 NOTE — ED Notes (Signed)
Positive for chlamydia- chart sent to edp office for review.

## 2014-11-18 ENCOUNTER — Telehealth (HOSPITAL_COMMUNITY): Payer: Self-pay

## 2014-11-18 NOTE — Telephone Encounter (Signed)
Pt informed on labs. Advised to abstain from sexual activity x 10 days after taking medication and notify partner(s) for testing and treatment.  Quincy Carnes PA reviewed chart and ordered Azithromycin 1000mg  po x1. This was called to [facility] per pt request. Order left on voicemail.

## 2015-03-12 ENCOUNTER — Emergency Department (HOSPITAL_BASED_OUTPATIENT_CLINIC_OR_DEPARTMENT_OTHER)
Admission: EM | Admit: 2015-03-12 | Discharge: 2015-03-12 | Disposition: A | Discharge status: Home / Self Care | Payer: BLUE CROSS/BLUE SHIELD | Attending: Emergency Medicine | Admitting: Emergency Medicine

## 2015-03-12 ENCOUNTER — Encounter (HOSPITAL_BASED_OUTPATIENT_CLINIC_OR_DEPARTMENT_OTHER): Payer: Self-pay | Admitting: Emergency Medicine

## 2015-03-12 DIAGNOSIS — Z8742 Personal history of other diseases of the female genital tract: Secondary | ICD-10-CM | POA: Insufficient documentation

## 2015-03-12 DIAGNOSIS — Z87891 Personal history of nicotine dependence: Secondary | ICD-10-CM | POA: Diagnosis not present

## 2015-03-12 DIAGNOSIS — Z86018 Personal history of other benign neoplasm: Secondary | ICD-10-CM | POA: Diagnosis not present

## 2015-03-12 DIAGNOSIS — Z8619 Personal history of other infectious and parasitic diseases: Secondary | ICD-10-CM | POA: Diagnosis not present

## 2015-03-12 DIAGNOSIS — B9689 Other specified bacterial agents as the cause of diseases classified elsewhere: Secondary | ICD-10-CM | POA: Insufficient documentation

## 2015-03-12 DIAGNOSIS — R300 Dysuria: Secondary | ICD-10-CM | POA: Diagnosis present

## 2015-03-12 DIAGNOSIS — N390 Urinary tract infection, site not specified: Secondary | ICD-10-CM | POA: Diagnosis not present

## 2015-03-12 DIAGNOSIS — Z3202 Encounter for pregnancy test, result negative: Secondary | ICD-10-CM | POA: Insufficient documentation

## 2015-03-12 DIAGNOSIS — N760 Acute vaginitis: Secondary | ICD-10-CM | POA: Insufficient documentation

## 2015-03-12 DIAGNOSIS — Z792 Long term (current) use of antibiotics: Secondary | ICD-10-CM | POA: Diagnosis not present

## 2015-03-12 HISTORY — DX: Acute vaginitis: N76.0

## 2015-03-12 HISTORY — DX: Other specified bacterial agents as the cause of diseases classified elsewhere: B96.89

## 2015-03-12 LAB — URINE MICROSCOPIC-ADD ON

## 2015-03-12 LAB — PREGNANCY, URINE: PREG TEST UR: NEGATIVE

## 2015-03-12 LAB — URINALYSIS, ROUTINE W REFLEX MICROSCOPIC
BILIRUBIN URINE: NEGATIVE
Glucose, UA: NEGATIVE mg/dL
KETONES UR: NEGATIVE mg/dL
Nitrite: NEGATIVE
PH: 6.5 (ref 5.0–8.0)
PROTEIN: 30 mg/dL — AB
Specific Gravity, Urine: 1.02 (ref 1.005–1.030)
Urobilinogen, UA: 1 mg/dL (ref 0.0–1.0)

## 2015-03-12 MED ORDER — NITROFURANTOIN MONOHYD MACRO 100 MG PO CAPS: 100.0000 mg | ORAL_CAPSULE | Freq: Two times a day (BID) | ORAL | Status: DC

## 2015-03-12 NOTE — ED Provider Notes (Signed)
CSN: [id number]     Arrival date & time 03/12/15  5732 History   First MD Initiated Contact with Patient 03/12/15 [phone]     Chief Complaint  Patient presents with  . Urinary Tract Infection   (Consider location/radiation/quality/duration/timing/severity/associated sxs/prior Treatment) HPI Comments: Gina Burns is a 24 year old female with a history of frequent BV infections presenting with 3 day history of urinary frequency and dysuria. She reports that she has had multiple episodes of feeling the urge to urinate and not being able to upon getting to the bathroom as well as burning with urination. She reports this feels like her last UTI. She was last sexually active 22 days ago. Has an IUD in place. Denies any fevers, chills, nausea, vomiting, vaginal discharge or odor. She does note suprapubic pain. Reports chronic lower back pain that is unchanged from her baseline.    The history is provided by the patient.   Past Medical History  Diagnosis Date  . Fibroids   . Hx of varicella   . Headache(784.0)   . BV (bacterial vaginosis)    Past Surgical History  Procedure Laterality Date  . Induced abortion     No family history on file. Social History  Substance Use Topics  . Smoking status: Former Research scientist (life sciences)  . Smokeless tobacco: Never Used  . Alcohol Use: No   OB History    Gravida Para Term Preterm AB TAB SAB Ectopic Multiple Living   2 1 1  1 1    1      Review of Systems  Constitutional: Negative for fever, chills and appetite change.  Gastrointestinal: Positive for abdominal pain. Negative for nausea and vomiting.  Genitourinary: Positive for dysuria, urgency, frequency, decreased urine volume and difficulty urinating. Negative for hematuria, flank pain, vaginal bleeding, vaginal discharge, vaginal pain, menstrual problem and pelvic pain.  Musculoskeletal: Negative for back pain.   Allergies  Review of patient's allergies indicates no known allergies.  Home Medications    Prior to Admission medications   Medication Sig Start Date End Date Taking? Authorizing Provider  metroNIDAZOLE (FLAGYL) 500 MG tablet Take 1 tablet (500 mg total) by mouth 2 (two) times daily. 11/12/14   Rosemarie Ax, MD  nitrofurantoin, macrocrystal-monohydrate, (MACROBID) 100 MG capsule Take 1 capsule (100 mg total) by mouth 2 (two) times daily. 03/12/15   Leonard Schwartz, MD   BP 129/78 mmHg  Pulse 88  Temp(Src) 99.5 F (37.5 C) (Oral)  Resp 18  Ht 5\' 4"  (1.626 m)  Wt 203 lb (92.08 kg)  BMI 34.83 kg/m2  SpO2 99% Physical Exam  Constitutional: She is oriented to person, place, and time. She appears well-developed and well-nourished. No distress.  HENT:  Head: Normocephalic and atraumatic.  Eyes: EOM are normal. Pupils are equal, round, and reactive to light.  Neck: Neck supple.  Cardiovascular: Normal rate, regular rhythm and normal heart sounds.   Pulmonary/Chest: Effort normal and breath sounds normal.  Abdominal: Soft. Bowel sounds are normal. There is tenderness in the suprapubic area. There is no guarding and no CVA tenderness.  Neurological: She is alert and oriented to person, place, and time.  Skin: Skin is warm and dry.  Psychiatric: She has a normal mood and affect.    ED Course  Procedures (including critical care time) Labs Review Labs Reviewed  URINALYSIS, ROUTINE W REFLEX MICROSCOPIC (NOT AT [facility]) - Abnormal; Notable for the following:    APPearance TURBID (*)    Hgb urine dipstick LARGE (*)  Protein, ur 30 (*)    Leukocytes, UA LARGE (*)    All other components within normal limits  URINE MICROSCOPIC-ADD ON - Abnormal; Notable for the following:    Squamous Epithelial / LPF FEW (*)    Bacteria, UA MANY (*)    All other components within normal limits  URINE CULTURE  PREGNANCY, URINE    Imaging Review No results found. I have personally reviewed and evaluated these images and lab results as part of my medical decision-making.   EKG  Interpretation None      MDM   Final diagnoses:  UTI (lower urinary tract infection)   Patient with UTI demonstrated on U/A. Will send home with Macrobid 100 mg PO bid for 5 days.     Maryellen Pile, MD 03/12/15 [place], MD 03/19/15 [phone]

## 2015-03-12 NOTE — Discharge Instructions (Signed)

## 2015-03-12 NOTE — ED Notes (Signed)
Thinks has a uti  Some burning with voiding

## 2015-03-14 LAB — URINE CULTURE: Culture: >=100000

## 2015-03-15 ENCOUNTER — Telehealth (HOSPITAL_BASED_OUTPATIENT_CLINIC_OR_DEPARTMENT_OTHER): Payer: Self-pay | Admitting: Emergency Medicine

## 2015-03-15 NOTE — Telephone Encounter (Incomplete)
Post ED Visit - Positive Culture Follow-up: Successful Patient Follow-Up  Culture assessed and recommendations reviewed by: []  Heide Guile, Pharm.D., BCPS-AQ ID [x]  Alycia Rossetti, Pharm.D., BCPS []  [place], Pharm.D., BCPS, AAHIVP []  Legrand Como, Pharm.D., BCPS, AAHIVP []  [place], Pharm.D. []  Milus Glazier, Pharm.D.  Positive urine culture Proteus  []  Patient discharged without antimicrobial prescription and treatment is now indicated [x]  Organism is resistant to prescribed ED discharge antimicrobial []  Patient with positive blood cultures  Changes discussed with ED provider: Alvina Chou PA New antibiotic prescription stop Macrobid, change to Bactrim DS 1 tab bid x 5 days Called to [facility] [phone]  Contacted patient, 03/15/15 1107   Hazle Nordmann 03/15/2015, 11:05 AM

## 2015-03-15 NOTE — Progress Notes (Signed)
ED Antimicrobial Stewardship Positive Culture Follow Up   Gina Burns is an 23 y.o. female who presented to [facility] on 03/12/2015 with a chief complaint of  Chief Complaint  Patient presents with  . Urinary Tract Infection    Recent Results (from the past 720 hour(s))  Urine culture     Status: None   Collection Time: 03/12/15  7:40 AM  Result Value Ref Range Status   Specimen Description URINE, CLEAN CATCH  Final   Special Requests NONE  Final   Culture   Final    >=100,000 COLONIES/mL PROTEUS MIRABILIS Performed at [facility]    Report Status 03/14/2015 FINAL  Final   Organism ID, Bacteria PROTEUS MIRABILIS  Final      Susceptibility   Proteus mirabilis - MIC*    AMPICILLIN <=2 SENSITIVE Sensitive     CEFAZOLIN <=4 SENSITIVE Sensitive     CEFTRIAXONE <=1 SENSITIVE Sensitive     CIPROFLOXACIN <=0.25 SENSITIVE Sensitive     GENTAMICIN <=1 SENSITIVE Sensitive     IMIPENEM 4 SENSITIVE Sensitive     NITROFURANTOIN 128 RESISTANT Resistant     TRIMETH/SULFA <=20 SENSITIVE Sensitive     AMPICILLIN/SULBACTAM <=2 SENSITIVE Sensitive     PIP/TAZO <=4 SENSITIVE Sensitive     * >=100,000 COLONIES/mL PROTEUS MIRABILIS    [x]  Treated with Nitrofurantoin, organism resistant to prescribed antimicrobial  New antibiotic prescription: Bactrim DS 1 tab twice daily for 5 days  ED Provider: Fredia Beets, PA-C  [name] 03/15/2015, 11:22 AM Infectious Diseases Pharmacist Phone# [phone]

## 2018-07-26 ENCOUNTER — Other Ambulatory Visit: Payer: Self-pay | Admitting: Obstetrics and Gynecology

## 2021-08-19 ENCOUNTER — Telehealth: Payer: Self-pay | Admitting: *Deleted

## 2021-08-19 NOTE — Telephone Encounter (Signed)
Both numbers listed are busy with no voicemail to leave message with appointment information. ?

## 2021-08-22 ENCOUNTER — Ambulatory Visit (INDEPENDENT_AMBULATORY_CARE_PROVIDER_SITE_OTHER): Payer: 59 | Admitting: Obstetrics & Gynecology

## 2021-08-22 ENCOUNTER — Encounter: Payer: Self-pay | Admitting: Obstetrics & Gynecology

## 2021-08-22 VITALS — BP 157/115 | HR 101 | Ht 63.0 in | Wt 256.0 lb

## 2021-08-22 DIAGNOSIS — Z3009 Encounter for other general counseling and advice on contraception: Secondary | ICD-10-CM

## 2021-08-22 DIAGNOSIS — Z975 Presence of (intrauterine) contraceptive device: Secondary | ICD-10-CM | POA: Diagnosis not present

## 2021-08-22 DIAGNOSIS — D219 Benign neoplasm of connective and other soft tissue, unspecified: Secondary | ICD-10-CM | POA: Diagnosis not present

## 2021-08-22 DIAGNOSIS — R030 Elevated blood-pressure reading, without diagnosis of hypertension: Secondary | ICD-10-CM

## 2021-08-22 MED ORDER — ETONOGESTREL 68 MG ~~LOC~~ IMPL: 1.0000 | DRUG_IMPLANT | Freq: Once | SUBCUTANEOUS | 0 refills | Status: AC

## 2021-08-22 MED ORDER — ETONOGESTREL 68 MG ~~LOC~~ IMPL: 68.0000 mg | DRUG_IMPLANT | Freq: Once | SUBCUTANEOUS | Status: DC

## 2021-08-22 NOTE — Patient Instructions (Signed)
Mirena lasts for 8 years ? ?Nexplanon can last up to 4 years ?

## 2021-08-22 NOTE — Progress Notes (Signed)
? ?GYNECOLOGY OFFICE VISIT NOTE ? ?History:  ? Gina Burns is a 31 y.o. G2P1011 here today for discussion about contraception.  Has Nexplanon in place since 07/26/2018, wants to remove this but unsure of what else she wants.  Irregular bleeding that is sometimes heavy reported with Nexplanon but otherwise, she feels it is okay. Had Mirena in the past, but had to undergo procedure under anesthesia to remove it and is apprehensive about another one.  History of fibroids, wants ultrasound for surveillance and to make sure they are not getting bigger and adding to her occasional heavy bleeding.  She denies any current abnormal vaginal discharge, bleeding, pelvic pain or other concerns.  ?  ?Past Medical History:  ?Diagnosis Date  ? Anemia, iron deficiency 05/02/2013  ? BV (bacterial vaginosis)   ? Fibroids   ? Headache(784.0)   ? Hx of varicella   ? Migraine, unspecified, without mention of intractable migraine without mention of status migrainosus 05/02/2013  ? ? ?Past Surgical History:  ?Procedure Laterality Date  ? INDUCED ABORTION    ? ? ?The following portions of the patient's history were reviewed and updated as appropriate: allergies, current medications, past family history, past medical history, past social history, past surgical history and problem list.  ? ?Health Maintenance:  Normal pap and negative HRHPV on 06/22/2020 at [place] ? ?Review of Systems:  ?Pertinent items noted in HPI and remainder of comprehensive ROS otherwise negative. ? ?Physical Exam:  ?BP (!) 157/115   Pulse (!) 101   Ht '5\' 3"'$  (1.6 m)   Wt 256 lb (116.1 kg)   BMI 45.35 kg/m?  ?CONSTITUTIONAL: Well-developed, well-nourished female in no acute distress.  ?HEENT:  Normocephalic, atraumatic. External right and left ear normal. No scleral icterus.  ?NECK: Normal range of motion, supple, no masses noted on observation ?SKIN: No rash noted. Not diaphoretic. No erythema. No pallor. Nexplanon palpated in inferior aspect of left upper  arm. ?MUSCULOSKELETAL: Normal range of motion. No edema noted. ?NEUROLOGIC: Alert and oriented to person, place, and time. Normal muscle tone coordination. No cranial nerve deficit noted. ?PSYCHIATRIC: Normal mood and affect. Normal behavior. Normal judgment and thought content. ?CARDIOVASCULAR: Normal heart rate noted ?RESPIRATORY: Effort and breath sounds normal, no problems with respiration noted ?ABDOMEN: No masses noted. No other overt distention noted.   ?PELVIC: Deferred ?    ?Assessment and Plan:  ?   ?1. Nexplanon in place since 07/26/2018 ?2. General counseling and advice for contraceptive management ?Nexplanon can be in place for up to 4 years unofficially, this was discussed with patient. ?Discussed forms of contraception, patient is leaning towards repeat Nexplanon and even considering Mirena again, She wants to give this further thought, will return soon for removal and reinsertion of Nexplanon vs IUD placement.  ? ?3. Fibroids ?Ultrasound ordered, will follow up results and manage accordingly. ?- US PELVIC COMPLETE WITH TRANSVAGINAL; Future ? ?4. Elevated blood pressure reading ?No diagnosis of HTN. Will continue to monitor.  Patient will follow up with PCP also.  ? ? ?Routine preventative health maintenance measures emphasized. ?Please refer to After Visit Summary for other counseling recommendations.  ? ?Return in about 1 month (around 09/21/2021) for Nexplanon removal, and possible reinsertion, or IUD placement.   ? ?I spent 30 minutes dedicated to the care of this patient including pre-visit review of records, face to face time with the patient discussing her conditions and treatments and post visit orders. ? ? ? ?Verita Schneiders, MD, FACOG ?Obstetrician &  Editor, commissioning, Water engineer ?[facility] ? ? ? ? ? ? ?

## 2022-02-22 ENCOUNTER — Ambulatory Visit (INDEPENDENT_AMBULATORY_CARE_PROVIDER_SITE_OTHER): Payer: 59 | Admitting: Obstetrics & Gynecology

## 2022-02-22 ENCOUNTER — Encounter: Payer: Self-pay | Admitting: Obstetrics & Gynecology

## 2022-02-22 VITALS — BP 148/88 | HR 92 | Resp 16 | Ht 63.0 in | Wt 249.0 lb

## 2022-02-22 DIAGNOSIS — R030 Elevated blood-pressure reading, without diagnosis of hypertension: Secondary | ICD-10-CM

## 2022-02-22 DIAGNOSIS — Z30017 Encounter for initial prescription of implantable subdermal contraceptive: Secondary | ICD-10-CM

## 2022-02-22 DIAGNOSIS — Z3046 Encounter for surveillance of implantable subdermal contraceptive: Secondary | ICD-10-CM

## 2022-02-22 MED ORDER — ETONOGESTREL 68 MG ~~LOC~~ IMPL
68.0000 mg | DRUG_IMPLANT | Freq: Once | SUBCUTANEOUS | Status: AC
  Administered 2022-02-22: 68 mg via SUBCUTANEOUS

## 2022-02-22 NOTE — Progress Notes (Signed)
   Subjective:    Patient ID: Gina Burns, female    DOB: 10-09-1990, 31 y.o.   MRN: [id number]  HPI  31 yo G2P1011 wanting Nexplanon removal and insertion,  Pt is nervous and her BP is high and needs  a PCP.  She is also going to get the [place] that was ordered earlier in the year.  She was waiting for HSA to build up.   Review of Systems  Constitutional: Negative.   Respiratory: Negative.    Cardiovascular: Negative.   Gastrointestinal: Negative.   Genitourinary: Negative.        Objective:   Physical Exam Vitals reviewed.  Constitutional:      General: She is not in acute distress.    Appearance: She is well-developed.  HENT:     Head: Normocephalic and atraumatic.  Eyes:     Conjunctiva/sclera: Conjunctivae normal.  Cardiovascular:     Rate and Rhythm: Normal rate.  Pulmonary:     Effort: Pulmonary effort is normal.  Skin:    General: Skin is warm and dry.  Neurological:     Mental Status: She is alert and oriented to person, place, and time.  Psychiatric:        Mood and Affect: Mood normal.   Vitals:   02/22/22 1119  BP: (!) 148/88  Pulse: 92  Resp: 16  Weight: 249 lb (112.9 kg)  Height: '5\' 3"'$  (1.6 m)      Assessment & Plan:  31 yo female with Nexplanon needing to removed and inserted.    Referral to Primary Care for HTN F/u after [place] for plan to address fibroids and menomet (could be due to nexplanon) Nexplanon Removal and Insertion  Patient was given informed consent for removal and insertion of Nexplanon.  Appropriate time out taken. Nexplanon site identified.  Area prepped in usual sterile fashon. One ml of 1% lidocaine was used to anesthetize the area at the distal end of the implant.  After adequate anesthesia was assured, a small stab incision was made right beside the implant on the distal portion.  The Nexplanon rod was grasped using hemostats and removed without difficulty.  There was minimal blood loss. There were no complications.      The  new Nexplanon device was placed over the triceps approximately 8-10 cm superior to the humeral medial epicondyle.  The Nexplanon was placed superficially by tenting the skin.  Neplanon was palpated in the correct position by myself and patient.  A small amount of antibiotic ointment and steri-strips were applied over the small incisions.  A pressure bandage was applied to reduce any bruising.  The patient tolerated the procedure well and was given post procedure instructions.

## 2022-04-24 ENCOUNTER — Encounter: Payer: Self-pay | Admitting: Obstetrics & Gynecology

## 2022-04-24 ENCOUNTER — Ambulatory Visit (INDEPENDENT_AMBULATORY_CARE_PROVIDER_SITE_OTHER): Payer: 59 | Admitting: Obstetrics & Gynecology

## 2022-04-24 ENCOUNTER — Telehealth: Payer: Self-pay | Admitting: *Deleted

## 2022-04-24 VITALS — BP 143/91 | HR 103 | Ht 63.0 in | Wt 252.0 lb

## 2022-04-24 DIAGNOSIS — Z975 Presence of (intrauterine) contraceptive device: Secondary | ICD-10-CM

## 2022-04-24 DIAGNOSIS — R030 Elevated blood-pressure reading, without diagnosis of hypertension: Secondary | ICD-10-CM | POA: Diagnosis not present

## 2022-04-24 DIAGNOSIS — Z86018 Personal history of other benign neoplasm: Secondary | ICD-10-CM | POA: Diagnosis not present

## 2022-04-24 DIAGNOSIS — N939 Abnormal uterine and vaginal bleeding, unspecified: Secondary | ICD-10-CM

## 2022-04-24 DIAGNOSIS — Z6841 Body Mass Index (BMI) 40.0 and over, adult: Secondary | ICD-10-CM

## 2022-04-24 MED ORDER — MEDROXYPROGESTERONE ACETATE 10 MG PO TABS: 10.0000 mg | ORAL_TABLET | Freq: Every day | ORAL | 0 refills | Status: AC

## 2022-04-24 MED ORDER — IBUPROFEN 600 MG PO TABS: 600.0000 mg | ORAL_TABLET | Freq: Four times a day (QID) | ORAL | 1 refills | Status: AC | PRN

## 2022-04-24 MED ORDER — MISOPROSTOL 200 MCG PO TABS: ORAL_TABLET | ORAL | 0 refills | Status: AC

## 2022-04-24 NOTE — Telephone Encounter (Signed)
Left patient a message about needed lab work.

## 2022-04-24 NOTE — Progress Notes (Signed)
GYN VISIT Patient name: Gina Burns MRN [id number]  Date of birth: 1990-12-09 Chief Complaint:   Abnormal Uterine Bleeding  History of Present Illness:   Gina Burns is a 31 y.o. G7P1011 female being seen today for the following concerns:  -AUB: She notes that she has been bleeding since Halloween.  Typically clementine-sized clots and due to the clots, tampons fall out.  Wearing both tampons and pads due to the heaviness of her bleeding.  Initially, her irregular bleeding was thought to be due to a "expired" Nexplanon; however, her bleeding has not improved.  Previously had IUD, which she loved; however, required surgical removal.  In office, when the device was "pulled" she had excruciating pain and the device seemed "stuck on something."  States it was a horrible experience and is the reason why she transitioned to nexplanon.  -Elevated BPs- Checking at home, typically 140-150s.  Denies headache or blurry vision.  Nexplanon placed Oct 2023.  Does not desire another pregnancy  Patient's last menstrual period was 03/21/2022.      No data to display           Review of Systems:   Pertinent items are noted in HPI Denies fever/chills, dizziness, headaches, visual disturbances, fatigue, shortness of breath, chest pain, abdominal pain, vomiting. Pertinent History Reviewed:  Reviewed past medical,surgical, social, obstetrical and family history.  Reviewed problem list, medications and allergies. Physical Assessment:   Vitals:   04/24/22 1357 04/24/22 1402  BP: (!) 166/105 (!) 143/91  Pulse: 95 (!) 103  Weight: 252 lb (114.3 kg)   Height: '5\' 3"'$  (1.6 m)   Body mass index is 44.64 kg/m.       Physical Examination:   General appearance: alert, well appearing, and in no distress  Psych: mood appropriate, normal affect  Skin: warm & dry   Cardiovascular: normal heart rate noted  Respiratory: normal respiratory effort, no distress  Abdomen: soft, non-tender,  no rebound, no guarding  Pelvic: VULVA: normal appearing vulva with no masses, tenderness or lesions, VAGINA: normal appearing vagina with normal color and discharge, no lesions, CERVIX: normal appearing cervix without discharge or lesions, UTERUS: uterus is normal size, shape, consistency and nontender, ADNEXA: normal adnexa in size, nontender and no masses- exam limited due to body habitus  Extremities: no edema , left arm with Nexplanon  Chaperone:  pt declined     Assessment & Plan:  1) AUB, h/o uterine fibroids -reviewed that most common complaint of Nexplanon is AUB -discussed management options ranging from additional progesterone with Nexplanon and continue to monitor vs transitioning to a different form of contraception -pt desires to return to IUD -premedications ordered -plan for pelvic US to re-evaluate size/locations of uterine fibroids -will also r/o underlying anemia -preop medications sent in  Orders Placed This Encounter  Procedures   CBC   Iron, TIBC and Ferritin Panel   Meds ordered this encounter  Medications   medroxyPROGESTERone (PROVERA) 10 MG tablet    Sig: Take 1 tablet (10 mg total) by mouth daily.    Dispense:  10 tablet    Refill:  0   misoprostol (CYTOTEC) 200 MCG tablet    Sig: Place 2 tablets in the vagina the night before IUD insertion    Dispense:  2 tablet    Refill:  0   ibuprofen (ADVIL) 600 MG tablet    Sig: Take 1 tablet (600 mg total) by mouth every 6 (six) hours as needed for mild pain or  moderate pain.    Dispense:  30 tablet    Refill:  1     Return for schedule pelvic US, then follow up for IUD placement 3-4wks.   Janyth Pupa, DO Attending [place], [facility] for [employer], [place]

## 2022-04-24 NOTE — Progress Notes (Signed)
First BP: 166/105 Repeat BP: 143/91

## 2022-04-25 LAB — IRON,TIBC AND FERRITIN PANEL
%SAT: 21 % (calc) (ref 16–45)
Ferritin: 11 ng/mL — ABNORMAL LOW (ref 16–154)
Iron: 89 ug/dL (ref 40–190)
TIBC: 421 mcg/dL (calc) (ref 250–450)

## 2022-04-25 LAB — CBC
HCT: 44 % (ref 35.0–45.0)
Hemoglobin: 14.9 g/dL (ref 11.7–15.5)
MCH: 31.2 pg (ref 27.0–33.0)
MCHC: 33.9 g/dL (ref 32.0–36.0)
MCV: 92.1 fL (ref 80.0–100.0)
MPV: 10.8 fL (ref 7.5–12.5)
Platelets: 299 10*3/uL (ref 140–400)
RBC: 4.78 10*6/uL (ref 3.80–5.10)
RDW: 12.6 % (ref 11.0–15.0)
WBC: 11.1 10*3/uL — ABNORMAL HIGH (ref 3.8–10.8)

## 2022-05-03 ENCOUNTER — Other Ambulatory Visit: Payer: 59

## 2022-05-29 ENCOUNTER — Ambulatory Visit: Payer: 59 | Admitting: Obstetrics & Gynecology

## 2022-08-10 ENCOUNTER — Ambulatory Visit (INDEPENDENT_AMBULATORY_CARE_PROVIDER_SITE_OTHER): Payer: 59

## 2022-08-10 DIAGNOSIS — D259 Leiomyoma of uterus, unspecified: Secondary | ICD-10-CM | POA: Diagnosis not present

## 2022-08-10 DIAGNOSIS — D219 Benign neoplasm of connective and other soft tissue, unspecified: Secondary | ICD-10-CM
